# Patient Record
Sex: Male | Born: 1955 | Race: White | Hispanic: No | Marital: Married | Smoking: Never smoker
Health system: Southern US, Community
[De-identification: ages and names within clinical notes are randomized; demographics above are authoritative.]

## PROBLEM LIST (undated history)

## (undated) DIAGNOSIS — E119 Type 2 diabetes mellitus without complications: Secondary | ICD-10-CM

## (undated) DIAGNOSIS — I639 Cerebral infarction, unspecified: Secondary | ICD-10-CM

## (undated) HISTORY — PX: GALLBLADDER SURGERY: SHX652

## (undated) HISTORY — DX: Cerebral infarction, unspecified: I63.9

## (undated) HISTORY — DX: Type 2 diabetes mellitus without complications: E11.9

## (undated) HISTORY — PX: TONSILLECTOMY: SUR1361

---

## 2013-10-08 ENCOUNTER — Emergency Department (HOSPITAL_COMMUNITY): Payer: BC Managed Care – PPO

## 2013-10-08 ENCOUNTER — Encounter (HOSPITAL_BASED_OUTPATIENT_CLINIC_OR_DEPARTMENT_OTHER): Payer: Self-pay | Admitting: Emergency Medicine

## 2013-10-08 ENCOUNTER — Inpatient Hospital Stay (HOSPITAL_BASED_OUTPATIENT_CLINIC_OR_DEPARTMENT_OTHER)
Admission: EM | Admit: 2013-10-08 | Discharge: 2013-10-09 | DRG: 065 | Disposition: A | Discharge status: Home / Self Care | Payer: BC Managed Care – PPO | Attending: Internal Medicine | Admitting: Internal Medicine

## 2013-10-08 ENCOUNTER — Inpatient Hospital Stay (HOSPITAL_COMMUNITY): Payer: BC Managed Care – PPO

## 2013-10-08 ENCOUNTER — Emergency Department (HOSPITAL_BASED_OUTPATIENT_CLINIC_OR_DEPARTMENT_OTHER): Payer: BC Managed Care – PPO

## 2013-10-08 DIAGNOSIS — Z79899 Other long term (current) drug therapy: Secondary | ICD-10-CM

## 2013-10-08 DIAGNOSIS — Z6841 Body Mass Index (BMI) 40.0 and over, adult: Secondary | ICD-10-CM

## 2013-10-08 DIAGNOSIS — I6350 Cerebral infarction due to unspecified occlusion or stenosis of unspecified cerebral artery: Secondary | ICD-10-CM

## 2013-10-08 DIAGNOSIS — R5381 Other malaise: Secondary | ICD-10-CM

## 2013-10-08 DIAGNOSIS — R29898 Other symptoms and signs involving the musculoskeletal system: Secondary | ICD-10-CM | POA: Diagnosis present

## 2013-10-08 DIAGNOSIS — R748 Abnormal levels of other serum enzymes: Secondary | ICD-10-CM | POA: Diagnosis present

## 2013-10-08 DIAGNOSIS — Z23 Encounter for immunization: Secondary | ICD-10-CM

## 2013-10-08 DIAGNOSIS — E1165 Type 2 diabetes mellitus with hyperglycemia: Secondary | ICD-10-CM | POA: Diagnosis present

## 2013-10-08 DIAGNOSIS — I6330 Cerebral infarction due to thrombosis of unspecified cerebral artery: Principal | ICD-10-CM | POA: Diagnosis present

## 2013-10-08 DIAGNOSIS — IMO0001 Reserved for inherently not codable concepts without codable children: Secondary | ICD-10-CM | POA: Diagnosis present

## 2013-10-08 DIAGNOSIS — F17200 Nicotine dependence, unspecified, uncomplicated: Secondary | ICD-10-CM

## 2013-10-08 DIAGNOSIS — E785 Hyperlipidemia, unspecified: Secondary | ICD-10-CM | POA: Diagnosis present

## 2013-10-08 DIAGNOSIS — IMO0002 Reserved for concepts with insufficient information to code with codable children: Secondary | ICD-10-CM | POA: Diagnosis present

## 2013-10-08 DIAGNOSIS — Z794 Long term (current) use of insulin: Secondary | ICD-10-CM

## 2013-10-08 DIAGNOSIS — R918 Other nonspecific abnormal finding of lung field: Secondary | ICD-10-CM | POA: Diagnosis present

## 2013-10-08 DIAGNOSIS — I639 Cerebral infarction, unspecified: Secondary | ICD-10-CM | POA: Diagnosis present

## 2013-10-08 DIAGNOSIS — R471 Dysarthria and anarthria: Secondary | ICD-10-CM | POA: Diagnosis present

## 2013-10-08 DIAGNOSIS — R739 Hyperglycemia, unspecified: Secondary | ICD-10-CM | POA: Diagnosis present

## 2013-10-08 DIAGNOSIS — R5383 Other fatigue: Secondary | ICD-10-CM

## 2013-10-08 DIAGNOSIS — Z7982 Long term (current) use of aspirin: Secondary | ICD-10-CM

## 2013-10-08 DIAGNOSIS — I5189 Other ill-defined heart diseases: Secondary | ICD-10-CM

## 2013-10-08 LAB — PROTIME-INR
INR: 1.01 (ref 0.00–1.49)
PROTHROMBIN TIME: 13.1 s (ref 11.6–15.2)

## 2013-10-08 LAB — COMPREHENSIVE METABOLIC PANEL
ALT: 206 U/L — ABNORMAL HIGH (ref 0–53)
AST: 114 U/L — ABNORMAL HIGH (ref 0–37)
Albumin: 3.9 g/dL (ref 3.5–5.2)
Alkaline Phosphatase: 67 U/L (ref 39–117)
BUN: 16 mg/dL (ref 6–23)
CALCIUM: 9.5 mg/dL (ref 8.4–10.5)
CO2: 25 meq/L (ref 19–32)
CREATININE: 0.7 mg/dL (ref 0.50–1.35)
Chloride: 95 mEq/L — ABNORMAL LOW (ref 96–112)
GFR calc non Af Amer: >90 mL/min (ref >90)
GLUCOSE: 409 mg/dL — AB (ref 70–99)
Potassium: 4.6 mEq/L (ref 3.7–5.3)
Sodium: 135 mEq/L — ABNORMAL LOW (ref 137–147)
Total Bilirubin: 0.6 mg/dL (ref 0.3–1.2)
Total Protein: 7.5 g/dL (ref 6.0–8.3)

## 2013-10-08 LAB — CBC WITH DIFFERENTIAL/PLATELET
Basophils Absolute: 0.1 10*3/uL (ref 0.0–0.1)
Basophils Relative: 1 % (ref 0–1)
EOS PCT: 2 % (ref 0–5)
Eosinophils Absolute: 0.1 10*3/uL (ref 0.0–0.7)
HEMATOCRIT: 45.4 % (ref 39.0–52.0)
Hemoglobin: 16.3 g/dL (ref 13.0–17.0)
LYMPHS ABS: 1.7 10*3/uL (ref 0.7–4.0)
LYMPHS PCT: 28 % (ref 12–46)
MCH: 30 pg (ref 26.0–34.0)
MCHC: 35.9 g/dL (ref 30.0–36.0)
MCV: 83.6 fL (ref 78.0–100.0)
Monocytes Absolute: 0.5 10*3/uL (ref 0.1–1.0)
Monocytes Relative: 9 % (ref 3–12)
Neutro Abs: 3.6 10*3/uL (ref 1.7–7.7)
Neutrophils Relative %: 61 % (ref 43–77)
Platelets: 178 10*3/uL (ref 150–400)
RBC: 5.43 MIL/uL (ref 4.22–5.81)
RDW: 12.8 % (ref 11.5–15.5)
WBC: 5.9 10*3/uL (ref 4.0–10.5)

## 2013-10-08 LAB — TROPONIN I
Troponin I: <0.3 ng/mL (ref <0.30)
Troponin I: <0.3 ng/mL (ref <0.30)

## 2013-10-08 LAB — GLUCOSE, CAPILLARY
GLUCOSE-CAPILLARY: 186 mg/dL — AB (ref 70–99)
Glucose-Capillary: 159 mg/dL — ABNORMAL HIGH (ref 70–99)

## 2013-10-08 LAB — ETHANOL

## 2013-10-08 LAB — APTT: aPTT: 31 seconds (ref 24–37)

## 2013-10-08 MED ORDER — INSULIN REGULAR HUMAN 100 UNIT/ML IJ SOLN
6.0000 [IU] | Freq: Once | INTRAMUSCULAR | Status: AC
  Administered 2013-10-08: 6 [IU] via INTRAVENOUS
  Filled 2013-10-08: qty 1

## 2013-10-08 MED ORDER — SODIUM CHLORIDE 0.9 % IV SOLN
INTRAVENOUS | Status: DC
  Administered 2013-10-08: 18:00:00 via INTRAVENOUS

## 2013-10-08 MED ORDER — INSULIN ASPART 100 UNIT/ML ~~LOC~~ SOLN: 0.0000 [IU] | Freq: Three times a day (TID) | SUBCUTANEOUS | Status: DC

## 2013-10-08 MED ORDER — ASPIRIN EC 81 MG PO TBEC
81.0000 mg | DELAYED_RELEASE_TABLET | Freq: Every day | ORAL | Status: DC
Start: 2013-10-09 — End: 2013-10-09
  Administered 2013-10-09: 81 mg via ORAL
  Filled 2013-10-08: qty 1

## 2013-10-08 MED ORDER — SODIUM CHLORIDE 0.9 % IJ SOLN: 3.0000 mL | INTRAMUSCULAR | Status: DC | PRN

## 2013-10-08 MED ORDER — ATORVASTATIN CALCIUM 80 MG PO TABS
80.0000 mg | ORAL_TABLET | Freq: Every day | ORAL | Status: DC
  Administered 2013-10-09: 80 mg via ORAL
  Filled 2013-10-08: qty 1

## 2013-10-08 MED ORDER — ASPIRIN 325 MG PO TABS
325.0000 mg | ORAL_TABLET | Freq: Once | ORAL | Status: AC
  Administered 2013-10-08: 325 mg via ORAL
  Filled 2013-10-08: qty 1

## 2013-10-08 MED ORDER — SODIUM CHLORIDE 0.9 % IJ SOLN: 3.0000 mL | Freq: Two times a day (BID) | INTRAMUSCULAR | Status: DC

## 2013-10-08 MED ORDER — INSULIN ASPART 100 UNIT/ML ~~LOC~~ SOLN
0.0000 [IU] | Freq: Every day | SUBCUTANEOUS | Status: DC
  Administered 2013-10-08: 3 [IU] via SUBCUTANEOUS

## 2013-10-08 MED ORDER — SODIUM CHLORIDE 0.9 % IV SOLN: Freq: Once | INTRAVENOUS | Status: DC

## 2013-10-08 MED ORDER — INSULIN GLARGINE 100 UNIT/ML ~~LOC~~ SOLN
10.0000 [IU] | Freq: Two times a day (BID) | SUBCUTANEOUS | Status: DC
Start: 2013-10-08 — End: 2013-10-09
  Administered 2013-10-08 – 2013-10-09 (×2): 10 [IU] via SUBCUTANEOUS
  Filled 2013-10-08 (×3): qty 0.1

## 2013-10-08 MED ORDER — HEPARIN SODIUM (PORCINE) 5000 UNIT/ML IJ SOLN
5000.0000 [IU] | Freq: Three times a day (TID) | INTRAMUSCULAR | Status: DC
  Administered 2013-10-09 (×2): 5000 [IU] via SUBCUTANEOUS
  Filled 2013-10-08 (×4): qty 1

## 2013-10-08 MED ORDER — SENNOSIDES-DOCUSATE SODIUM 8.6-50 MG PO TABS: 1.0000 | ORAL_TABLET | Freq: Every evening | ORAL | Status: DC | PRN

## 2013-10-08 MED ORDER — OXYCODONE-ACETAMINOPHEN 5-325 MG PO TABS: 2.0000 | ORAL_TABLET | Freq: Once | ORAL | Status: DC

## 2013-10-08 MED ORDER — SODIUM CHLORIDE 0.9 % IV SOLN: 250.0000 mL | INTRAVENOUS | Status: DC | PRN

## 2013-10-08 MED ORDER — SODIUM CHLORIDE 0.9 % IJ SOLN
3.0000 mL | Freq: Two times a day (BID) | INTRAMUSCULAR | Status: DC
  Administered 2013-10-09: 3 mL via INTRAVENOUS

## 2013-10-08 NOTE — ED Provider Notes (Signed)
CSN: [id number]     Arrival date & time 10/08/13  0946 History   First MD Initiated Contact with Patient 10/08/13 1027     Chief Complaint  Patient presents with  . Extremity Weakness   (Consider location/radiation/quality/duration/timing/severity/associated sxs/prior Treatment) HPI Comments: Patient is a 58 year old male who presents with complaints of right arm and leg weakness that started yesterday at approximately 4 PM. He feels as though he has adequate strength however does not feel as though his arm and leg are "responsive". His wife is also noticed he is having a difficulty forming words and expressing what he wants to say.  Patient is a 58 y.o. male presenting with extremity weakness. The history is provided by the patient.  Extremity Weakness This is a new problem. Episode onset: Yesterday at 4 PM. The problem occurs constantly. The problem has not changed since onset.Pertinent negatives include no chest pain, no abdominal pain, no headaches and no shortness of breath. Nothing aggravates the symptoms. Nothing relieves the symptoms. He has tried nothing for the symptoms. The treatment provided no relief.    History reviewed. No pertinent past medical history. Past Surgical History  Procedure Laterality Date  . Tonsillectomy     No family history on file. History  Substance Use Topics  . Smoking status: Never Smoker   . Smokeless tobacco: Not on file  . Alcohol Use: No    Review of Systems  Respiratory: Negative for shortness of breath.   Cardiovascular: Negative for chest pain.  Gastrointestinal: Negative for abdominal pain.  Musculoskeletal: Positive for extremity weakness.  Neurological: Negative for headaches.  All other systems reviewed and are negative.    Allergies  Review of patient's allergies indicates no known allergies.  Home Medications  No current outpatient prescriptions on file. BP 170/92  Pulse 74  Temp(Src) 98.2 F (36.8 C) (Oral)  Resp 20  Ht  6' (1.829 m)  Wt 300 lb (136.079 kg)  BMI 40.68 kg/m2  SpO2 100% Physical Exam  Nursing note and vitals reviewed. Constitutional: He is oriented to person, place, and time. He appears well-developed and well-nourished. No distress.  HENT:  Head: Normocephalic and atraumatic.  Mouth/Throat: Oropharynx is clear and moist.  Eyes: EOM are normal. Pupils are equal, round, and reactive to light.  Neck: Normal range of motion. Neck supple.  Cardiovascular: Normal rate, regular rhythm and normal heart sounds.   No murmur heard. Pulmonary/Chest: Effort normal and breath sounds normal. No respiratory distress. He has no wheezes.  Abdominal: Soft. Bowel sounds are normal. He exhibits no distension. There is no tenderness.  Musculoskeletal: Normal range of motion. He exhibits no edema.  Lymphadenopathy:    He has no cervical adenopathy.  Neurological: He is alert and oriented to person, place, and time. No cranial nerve deficit. He exhibits normal muscle tone. Coordination normal.  Skin: Skin is warm and dry. He is not diaphoretic.    ED Course  Procedures (including critical care time) Labs Review Labs Reviewed  CBC WITH DIFFERENTIAL  COMPREHENSIVE METABOLIC PANEL   Imaging Review No results found.  EKG Interpretation    Date/Time:  Thursday October 08 2013 09:58:31 EST Ventricular Rate:  72 PR Interval:  168 QRS Duration: 118 QT Interval:  426 QTC Calculation: 466 R Axis:   -51 Text Interpretation:  Normal sinus rhythm Left anterior fascicular block Left ventricular hypertrophy with QRS widening Abnormal ECG Confirmed by DELOS  MD, Shanera Meske ([id number]) on 10/08/2013 10:36:21 AM  MDM  No diagnosis found. Patient is a 58 year old male presents with complaint of numbness of the right arm and leg but has been ongoing since yesterday at 4 PM. His neurologic exam is nonfocal and his symptoms are mainly subjective. Workup reveals a negative head CT along with a normal CBC. His  electrolyte panel does reveal an elevated glucose of 409. He was given 1 L of normal saline along with IV insulin to help correct this. I've discussed this patient with Dr. Leroy Kennedy from neurology who recommends completion of the TIA rule out with an MRI. As that is not available here today, he will be transferred to [facility] to undergo neurology consultation and MRI.    Geoffery Lyons, MD 10/08/13 [phone]

## 2013-10-08 NOTE — ED Notes (Signed)
Pt c/o right arm feeling "weak but equal strength" since 4pm yesterday. No facial asymmetry noted, equal grips and leg strength. Pt wife sts she thinks pt "doesn't seem to be as steady as normal".

## 2013-10-08 NOTE — H&P (Signed)
Date: 10/08/2013               Patient Name:  Lee Adams MRN: [id number]  DOB: 1956/04/18 Age / Sex: 58 y.o., male   PCP: No Pcp Per Patient         Medical Service: Internal Medicine Teaching Service         Attending Physician: Dr. Rocco Serene, MD    First Contact: Dr. Evelena Peat Pager: [phone]  Second Contact: Dr. Dede Query Pager: [phone]       After Hours (After 5p/  First Contact Pager: [phone]  weekends / holidays): Second Contact Pager: [phone]   Chief Complaint: right sided not responding  History of Present Illness: Lee Adams is a 58 year old male without significant past medical history who presented to [facility] today with 2 day complaint of right sided weakness.  The sensation began yesterday evening around 4AM.  He says his right side "wasn't responding."  He continued with his usual evening activities, was able to eat dinner and slept without problem.  When he awoke this morning the feeling was worse.  He says he does not feel weak but he feels like his right side is slower to respond.  He denies change in vision, gait dysfunction, lightheadedness, dizziness, headache, CP, palpitations or SOB.  He lives with his wife and she did not note any facial asymmetry, dysarthria or gait abnormality.  However, she did note that his speech seemed slower and that he seemed less confident while driving their new vehicle yesterday.   In the Med [facility] ED:  T 98.56F, RR 20, SpO2 100%, HR74; negative head CT; consult to neuro; BG 409.  Meds: Current Facility-Administered Medications  Medication Dose Route Frequency Provider Last Rate Last Dose  . 0.9 %  sodium chloride infusion   Intravenous STAT Hurman Horn, MD 125 mL/hr at 10/08/13 1818    . 0.9 %  sodium chloride infusion  250 mL Intravenous PRN Na Dierdre Searles, MD      . Melene Muller ON 10/09/2013] aspirin EC tablet 81 mg  81 mg Oral Daily Na Dierdre Searles, MD      . Melene Muller ON 10/09/2013] atorvastatin (LIPITOR) tablet 80 mg  80 mg  Oral q1800 Na Li, MD      . Melene Muller ON 10/09/2013] heparin injection 5,000 Units  5,000 Units Subcutaneous Q8H Na Li, MD      . insulin aspart (novoLOG) injection 0-5 Units  0-5 Units Subcutaneous QHS Na Li, MD      . Melene Muller ON 10/09/2013] insulin aspart (novoLOG) injection 0-9 Units  0-9 Units Subcutaneous TID WC Na Dierdre Searles, MD      . insulin glargine (LANTUS) injection 10 Units  10 Units Subcutaneous BID Na Li, MD      . senna-docusate (Senokot-S) tablet 1 tablet  1 tablet Oral QHS PRN Na Li, MD      . sodium chloride 0.9 % injection 3 mL  3 mL Intravenous Q12H Na Li, MD      . sodium chloride 0.9 % injection 3 mL  3 mL Intravenous Q12H Na Li, MD      . sodium chloride 0.9 % injection 3 mL  3 mL Intravenous PRN Dede Query, MD        Allergies: Allergies as of 10/08/2013  . (No Known Allergies)   History reviewed. No pertinent past medical history. Past Surgical History  Procedure Laterality Date  . Tonsillectomy  No family history on file. History   Social History  . Marital Status: Married    Spouse Name: N/A    Number of Children: N/A  . Years of Education: N/A   Occupational History  . Not on file.   Social History Main Topics  . Smoking status: Never Smoker   . Smokeless tobacco: Not on file  . Alcohol Use: No  . Drug Use: No  . Sexual Activity: Not on file   Other Topics Concern  . Not on file   Social History Narrative  . No narrative on file    Review of Systems: Pertinent items are noted in HPI.  Also reports polydipsia and polyuria for past few weeks.  Physical Exam: Blood pressure 156/80, pulse 68, temperature 97.8 F (36.6 C), temperature source Oral, resp. rate 18, height 6' (1.829 m), weight 136.09 kg (300 lb 0.4 oz), SpO2 91.00%. General: resting in bed in NAD HEENT: PERRL, EOMI, no scleral icterus Cardiac: RRR, no rubs, murmurs or gallops Pulm: clear to auscultation bilaterally, moving normal volumes of air Abd: soft, nontender, nondistended, BS  present Ext: warm and well perfused, no pedal edema, 2+ DPs Neuro: alert and oriented X3, cranial nerves II-XII grossly intact, 4.5/5 MMS RUE and RLE, 5/5 MMS LUE and LLE, intact sensation and coordination  Lab results: Basic Metabolic Panel:  Recent Labs  16/07/9600/01/15 1045  NA 135*  K 4.6  CL 95*  CO2 25  GLUCOSE 409*  BUN 16  CREATININE 0.70  CALCIUM 9.5   Liver Function Tests:  Recent Labs  10/08/13 1045  AST 114*  ALT 206*  ALKPHOS 67  BILITOT 0.6  PROT 7.5  ALBUMIN 3.9   CBC:  Recent Labs  10/08/13 1045  WBC 5.9  NEUTROABS 3.6  HGB 16.3  HCT 45.4  MCV 83.6  PLT 178   Cardiac Enzymes:  Recent Labs  10/08/13 1045  TROPONINI <0.30   CBG:  Recent Labs  10/08/13 1415 10/08/13 1735  GLUCAP 159* 186*   Coagulation:  Recent Labs  10/08/13 1631  LABPROT 13.1  INR 1.01   Alcohol Level:  Recent Labs  10/08/13 1631  ETH <11    Imaging results:  Ct Head Wo Contrast  10/08/2013   CLINICAL DATA:  Right-sided weakness.  EXAM: CT HEAD WITHOUT CONTRAST  TECHNIQUE: Contiguous axial images were obtained from the base of the skull through the vertex without intravenous contrast.  COMPARISON:  None.  FINDINGS: Ventricles, cisterns and other CSF spaces are within normal. There is no mass, mass effect, shift of midline structures or acute hemorrhage. There is no evidence to suggest acute infarction. Bones and soft tissues are unremarkable.  IMPRESSION: No acute intracranial findings.   Electronically Signed   By: Elberta Fortisaniel  Boyle M.D.   On: 10/08/2013 11:02   Mr Maxine GlennMra Head Wo Contrast  10/08/2013   CLINICAL DATA:  Right arm and leg numbness.  EXAM: MRI HEAD WITHOUT CONTRAST  MRA HEAD WITHOUT CONTRAST  TECHNIQUE: Multiplanar, multiecho pulse sequences of the brain and surrounding structures were obtained without intravenous contrast. Angiographic images of the head were obtained using MRA technique without contrast.  COMPARISON:  Head CT same day  FINDINGS: MRI HEAD  FINDINGS  There is a 1 cm region of acute infarction affecting the left pons. No other acute infarction. No cerebellar abnormality. The cerebral hemispheres show mild chronic small-vessel changes of the deep and subcortical white matter. No cortical or large vessel territory infarction. No mass lesion, hemorrhage,  hydrocephalus or extra-axial collection. No pituitary mass. No inflammatory sinus disease.  MRA HEAD FINDINGS  Both internal carotid arteries are widely patent into the brain. No siphon stenosis. The anterior and middle cerebral vessels are patent. There is atherosclerotic irregularity diffusely, most pronounced in the left MCA territory. There is stenosis at the left MCA bifurcation that appears to be severe. This would place the patient at risk of left MCA territory infarction.  Both vertebral arteries are patent with the left being dominant. There is artifact in the region with a left vertebral artery that simulates a localized dissection. This is not the case. The basilar artery shows some atherosclerotic irregularity but no flow-limiting stenosis. Posterior circulation branch vessels appear patent. The posterior cerebral arteries show some atherosclerotic irregularity.  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  Mild chronic small-vessel changes affecting the cerebral hemispheric deep white matter.  Advanced intracranial atherosclerotic disease, most severe in the left MCA territory. Severe stenosis at the left MCA bifurcation could place the patient at risk of left MCA territory infarction in the future.  Atherosclerotic irregularity of the posterior circulation branch vessels without dominant or correctable stenosis.   Electronically Signed   By: Paulina Fusi M.D.   On: 10/08/2013 15:52   Mr Brain Wo Contrast  10/08/2013   CLINICAL DATA:  Right arm and leg numbness.  EXAM: MRI HEAD WITHOUT CONTRAST  MRA HEAD WITHOUT CONTRAST  TECHNIQUE: Multiplanar, multiecho pulse sequences of  the brain and surrounding structures were obtained without intravenous contrast. Angiographic images of the head were obtained using MRA technique without contrast.  COMPARISON:  Head CT same day  FINDINGS: MRI HEAD FINDINGS  There is a 1 cm region of acute infarction affecting the left pons. No other acute infarction. No cerebellar abnormality. The cerebral hemispheres show mild chronic small-vessel changes of the deep and subcortical white matter. No cortical or large vessel territory infarction. No mass lesion, hemorrhage, hydrocephalus or extra-axial collection. No pituitary mass. No inflammatory sinus disease.  MRA HEAD FINDINGS  Both internal carotid arteries are widely patent into the brain. No siphon stenosis. The anterior and middle cerebral vessels are patent. There is atherosclerotic irregularity diffusely, most pronounced in the left MCA territory. There is stenosis at the left MCA bifurcation that appears to be severe. This would place the patient at risk of left MCA territory infarction.  Both vertebral arteries are patent with the left being dominant. There is artifact in the region with a left vertebral artery that simulates a localized dissection. This is not the case. The basilar artery shows some atherosclerotic irregularity but no flow-limiting stenosis. Posterior circulation branch vessels appear patent. The posterior cerebral arteries show some atherosclerotic irregularity.  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  Mild chronic small-vessel changes affecting the cerebral hemispheric deep white matter.  Advanced intracranial atherosclerotic disease, most severe in the left MCA territory. Severe stenosis at the left MCA bifurcation could place the patient at risk of left MCA territory infarction in the future.  Atherosclerotic irregularity of the posterior circulation branch vessels without dominant or correctable stenosis.   Electronically Signed   By: Paulina Fusi M.D.   On:  10/08/2013 15:52    Assessment & Plan by Problem: 58 year old male without significant past medical history who presents with 2 day complaint of right sided weakness and MRI confirmed infarction.  CVA:  Right sided weakness, slow speech with MRI confirmed 1cm acute infarction affecting left pons.  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  Denies HTN, HLD, or cigarette smoking (smokes cigars occasionally).  He may have undiagnosed DM (see below) which is a risk factor.   - Admit to IMTS  - start daily 81mg  ASA, Lipitor 80mg  - q2h neuro checks x 12 hours, then q4h - ECHO and carotid dopplers - Hgb A1c, fasting lipid panel  Type 2 DM:  Patient with BG 409 on BMP.  He also reports increased thirst and urination for the past few weeks.  TDD is 68 units, basal requirement 34 units.   - will start 10 units Lantus BID in this insulin naive patient; SSI - check HgbA1c, monitor CBG including 3AM check - DM education - will need list of primary care providers prior to d/c - social work consulted  Elevation of ALT and AST: ALT 114, AST 206.  He denies significant drinking history.  No past medical history. - recheck CMP in AM  VTE ppx:   SCDs; [place] heparin starting 10/09/13  Dispo: Disposition is deferred at this time, awaiting improvement of current medical problems. Anticipated discharge in approximately 1-2 day(s).   The patient does not have a current PCP (No Pcp Per Patient) and does need an [facility] hospital follow-up appointment after discharge.  The patient does not have transportation limitations that hinder transportation to clinic appointments.  Signed: Evelena Peat, DO 10/08/2013, 8:43 PM

## 2013-10-08 NOTE — ED Notes (Signed)
Received from [facility] truck [id number]-- [facility]. Pt alert, sitting upright on arrival

## 2013-10-08 NOTE — ED Provider Notes (Signed)
Last known well yesterday afternoon now has 22 hours of mild dysarthria mild weakness and numbness right face arm and leg with mild incoordination right [facility] no bleeding [facility] and neuro [facility].  D/w Neuro and Med for admit.Patient / Family / Caregiver informed of clinical course, understand medical decision-making process, and agree with plan.  Hurman HornJohn M Annelle Behrendt, MD 10/09/13 [phone]

## 2013-10-08 NOTE — Consult Note (Signed)
Referring Physician: Stevie Kern    Chief Complaint: right sided weakness  HPI:                                                                                                                                         Lee Adams is an 58 y.o. male who noted at approximately 4 PM yesterday his right arm legend face felt funny and his right arm was more clumsy than normal. HE did not seek medical attention at that time. Due to awakening this AM and symptoms had not resolved he was brought to [facility].  There his blood sugar was 409 and he continued to have right sided weakness. Stroke was suspected and patient was brought to [facility] for further evaluation.   On arrival patient obtained a MRI brian which confirmed a left pontine infarct. Patient continues to have right arm and leg weakness along with dysarthria.   Date last known well: Date: 10/07/2013 Time last known well: Time: 16:00 tPA Given: No: out of the window  History reviewed. No pertinent past medical history.  Past Surgical History  Procedure Laterality Date  . Tonsillectomy      No family history on file. Social History:  reports that he has never smoked. He does not have any smokeless tobacco history on file. He reports that he does not drink alcohol or use illicit drugs.  Allergies: No Known Allergies  Medications:                                                                                                                           Current Facility-Administered Medications  Medication Dose Route Frequency Provider Last Rate Last Dose  . 0.9 %  sodium chloride infusion   Intravenous Once Veryl Speak, MD       No current outpatient prescriptions on file.     ROS:  History obtained from the patient  General ROS: negative for - chills, fatigue, fever, night sweats, weight  gain or weight loss Psychological ROS: negative for - behavioral disorder, hallucinations, memory difficulties, mood swings or suicidal ideation Ophthalmic ROS: negative for - blurry vision, double vision, eye pain or loss of vision ENT ROS: negative for - epistaxis, nasal discharge, oral lesions, sore throat, tinnitus or vertigo Allergy and Immunology ROS: negative for - hives or itchy/watery eyes Hematological and Lymphatic ROS: negative for - bleeding problems, bruising or swollen lymph nodes Endocrine ROS: negative for - galactorrhea, hair pattern changes, polydipsia/polyuria or temperature intolerance Respiratory ROS: negative for - cough, hemoptysis, shortness of breath or wheezing Cardiovascular ROS: negative for - chest pain, dyspnea on exertion, edema or irregular heartbeat Gastrointestinal ROS: negative for - abdominal pain, diarrhea, hematemesis, nausea/vomiting or stool incontinence Genito-Urinary ROS: negative for - dysuria, hematuria, incontinence or urinary frequency/urgency Musculoskeletal ROS: negative for - joint swelling or muscular weakness Neurological ROS: as noted in HPI Dermatological ROS: negative for rash and skin lesion changes  Neurologic Examination:                                                                                                      Blood pressure 124/80, pulse 80, temperature 98.1 F (36.7 C), temperature source Oral, resp. rate 17, height 6' (1.829 m), weight 136.079 kg (300 lb), SpO2 94.00%.   Mental Status: Alert, oriented, thought content appropriate.  Speech dysarthric without evidence of aphasia.  Able to follow 3 step commands without difficulty. Cranial Nerves: II: Discs flat bilaterally; Visual fields grossly normal, pupils equal, round, reactive to light and accommodation III,IV, VI: ptosis not present, extra-ocular motions intact bilaterally V,VII: smile symmetric, facial light touch sensation normal bilaterally VIII: hearing normal  bilaterally IX,X: gag reflex present XI: bilateral shoulder shrug XII: midline tongue extension without atrophy or fasciculations  Motor: Right : Upper extremity   4/5    Left:     Upper extremity   5/5  Lower extremity   4/5     Lower extremity   5/5 --no drift noted Tone and bulk:normal tone throughout; no atrophy noted Sensory: Pinprick and light touch intact throughout, bilaterally Deep Tendon Reflexes:  Right: Upper Extremity   Left: Upper extremity   biceps (C-5 to C-6) 2/4   biceps (C-5 to C-6) 2/4 tricep (C7) 2/4    triceps (C7) 2/4 Brachioradialis (C6) 2/4  Brachioradialis (C6) 2/4  Lower Extremity Lower Extremity  quadriceps (L-2 to L-4) 2/4   quadriceps (L-2 to L-4) 2/4 Achilles (S1) 2/4   Achilles (S1) 2/4  Plantars: Right: downgoing   Left: downgoing Cerebellar: Dysmetric finger-to-nose on the right likely due to decreased strangth,  Ataxic heel-to-shin test on the right likely due to strength Gait: not tested due to multiple leads.  CV: pulses palpable throughout    Results for orders placed during the hospital encounter of 10/08/13 (from the past 48 hour(s))  CBC WITH DIFFERENTIAL     Status: None   Collection Time    10/08/13 10:45 AM  Result Value Range   WBC 5.9  4.0 - 10.5 K/uL   RBC 5.43  4.22 - 5.81 MIL/uL   Hemoglobin 16.3  13.0 - 17.0 g/dL   HCT 45.4  39.0 - 52.0 %   MCV 83.6  78.0 - 100.0 fL   MCH 30.0  26.0 - 34.0 pg   MCHC 35.9  30.0 - 36.0 g/dL   RDW 12.8  11.5 - 15.5 %   Platelets 178  150 - 400 K/uL   Neutrophils Relative % 61  43 - 77 %   Neutro Abs 3.6  1.7 - 7.7 K/uL   Lymphocytes Relative 28  12 - 46 %   Lymphs Abs 1.7  0.7 - 4.0 K/uL   Monocytes Relative 9  3 - 12 %   Monocytes Absolute 0.5  0.1 - 1.0 K/uL   Eosinophils Relative 2  0 - 5 %   Eosinophils Absolute 0.1  0.0 - 0.7 K/uL   Basophils Relative 1  0 - 1 %   Basophils Absolute 0.1  0.0 - 0.1 K/uL  COMPREHENSIVE METABOLIC PANEL     Status: Abnormal   Collection Time     10/08/13 10:45 AM      Result Value Range   Sodium 135 (*) 137 - 147 mEq/L   Comment: Please note change in reference range.   Potassium 4.6  3.7 - 5.3 mEq/L   Comment: Please note change in reference range.   Chloride 95 (*) 96 - 112 mEq/L   CO2 25  19 - 32 mEq/L   Glucose, Bld 409 (*) 70 - 99 mg/dL   BUN 16  6 - 23 mg/dL   Creatinine, Ser 0.70  0.50 - 1.35 mg/dL   Calcium 9.5  8.4 - 10.5 mg/dL   Total Protein 7.5  6.0 - 8.3 g/dL   Albumin 3.9  3.5 - 5.2 g/dL   AST 114 (*) 0 - 37 U/L   ALT 206 (*) 0 - 53 U/L   Alkaline Phosphatase 67  39 - 117 U/L   Total Bilirubin 0.6  0.3 - 1.2 mg/dL   GFR calc non Af Amer >90  >90 mL/min   GFR calc Af Amer >90  >90 mL/min   Comment: (NOTE)     The eGFR has been calculated using the CKD EPI equation.     This calculation has not been validated in all clinical situations.     eGFR's persistently <90 mL/min signify possible Chronic Kidney     Disease.  TROPONIN I     Status: None   Collection Time    10/08/13 10:45 AM      Result Value Range   Troponin I <0.30  <0.30 ng/mL   Comment:            Due to the release kinetics of cTnI,     a negative result within the first hours     of the onset of symptoms does not rule out     myocardial infarction with certainty.     If myocardial infarction is still suspected,     repeat the test at appropriate intervals.  GLUCOSE, CAPILLARY     Status: Abnormal   Collection Time    10/08/13  2:15 PM      Result Value Range   Glucose-Capillary 159 (*) 70 - 99 mg/dL   Ct Head Wo Contrast  10/08/2013   CLINICAL DATA:  Right-sided weakness.  EXAM: CT HEAD WITHOUT CONTRAST  TECHNIQUE: Contiguous axial images were obtained from the base of the skull through the vertex without intravenous contrast.  COMPARISON:  None.  FINDINGS: Ventricles, cisterns and other CSF spaces are within normal. There is no mass, mass effect, shift of midline structures or acute hemorrhage. There is no evidence to suggest acute  infarction. Bones and soft tissues are unremarkable.  IMPRESSION: No acute intracranial findings.   Electronically Signed   By: Marin Olp M.D.   On: 10/08/2013 11:02    Assessment and plan discussed with with attending physician and they are in agreement.    Stroke Risk Factors - none  Etta Quill PA-C [facility] [phone]  10/08/2013, 3:43 PM   Assessment: 58 y.o. male with new onset of dysarthria, right sided weakness in the setting of acute left pontine infarct and hyperglycemia.  Initial systolic BP was elevated at 170 but currently 124. Infarct likely secondary to small vessel disease given distribution and elevated BG.   Recommend:   Plan: 1. HgbA1c, fasting lipid panel 2. PT consult, OT consult, Speech consult 3. Echocardiogram 4. Carotid dopplers 5. Prophylactic therapy-Antiplatelet med: Aspirin - dose  81 mg daily 6. Risk  Factor modification        Patient seen and examined together with physician assistant and I concur with the assessment and plan.  Dorian Pod, MD

## 2013-10-08 NOTE — Progress Notes (Signed)
Patient was admitted to the floor through the ED after reported was given 30 minutes after. Alert and oriented. Vitals checked and recorded in epic. Oriented to the room and call bell given to him.

## 2013-10-09 ENCOUNTER — Inpatient Hospital Stay (HOSPITAL_COMMUNITY): Payer: BC Managed Care – PPO

## 2013-10-09 DIAGNOSIS — I6350 Cerebral infarction due to unspecified occlusion or stenosis of unspecified cerebral artery: Secondary | ICD-10-CM

## 2013-10-09 DIAGNOSIS — R748 Abnormal levels of other serum enzymes: Secondary | ICD-10-CM

## 2013-10-09 DIAGNOSIS — I519 Heart disease, unspecified: Secondary | ICD-10-CM

## 2013-10-09 DIAGNOSIS — R918 Other nonspecific abnormal finding of lung field: Secondary | ICD-10-CM

## 2013-10-09 DIAGNOSIS — IMO0002 Reserved for concepts with insufficient information to code with codable children: Secondary | ICD-10-CM | POA: Diagnosis present

## 2013-10-09 DIAGNOSIS — I5189 Other ill-defined heart diseases: Secondary | ICD-10-CM | POA: Diagnosis present

## 2013-10-09 DIAGNOSIS — E1165 Type 2 diabetes mellitus with hyperglycemia: Secondary | ICD-10-CM | POA: Diagnosis present

## 2013-10-09 LAB — COMPREHENSIVE METABOLIC PANEL
ALBUMIN: 3.5 g/dL (ref 3.5–5.2)
ALK PHOS: 60 U/L (ref 39–117)
ALT: 200 U/L — ABNORMAL HIGH (ref 0–53)
AST: 120 U/L — AB (ref 0–37)
BILIRUBIN TOTAL: 0.6 mg/dL (ref 0.3–1.2)
BUN: 20 mg/dL (ref 6–23)
CHLORIDE: 98 meq/L (ref 96–112)
CO2: 26 meq/L (ref 19–32)
Calcium: 9.3 mg/dL (ref 8.4–10.5)
Creatinine, Ser: 0.73 mg/dL (ref 0.50–1.35)
GFR calc Af Amer: >90 mL/min (ref >90)
GFR calc non Af Amer: >90 mL/min (ref >90)
Glucose, Bld: 289 mg/dL — ABNORMAL HIGH (ref 70–99)
POTASSIUM: 4.4 meq/L (ref 3.7–5.3)
Sodium: 137 mEq/L (ref 137–147)
Total Protein: 6.9 g/dL (ref 6.0–8.3)

## 2013-10-09 LAB — GLUCOSE, CAPILLARY
GLUCOSE-CAPILLARY: 276 mg/dL — AB (ref 70–99)
Glucose-Capillary: 288 mg/dL — ABNORMAL HIGH (ref 70–99)
Glucose-Capillary: 301 mg/dL — ABNORMAL HIGH (ref 70–99)
Glucose-Capillary: 296 mg/dL — ABNORMAL HIGH (ref 70–99)

## 2013-10-09 LAB — CBC
HCT: 43.8 % (ref 39.0–52.0)
Hemoglobin: 15.7 g/dL (ref 13.0–17.0)
MCH: 30.3 pg (ref 26.0–34.0)
MCHC: 35.8 g/dL (ref 30.0–36.0)
MCV: 84.4 fL (ref 78.0–100.0)
PLATELETS: 181 10*3/uL (ref 150–400)
RBC: 5.19 MIL/uL (ref 4.22–5.81)
RDW: 12.7 % (ref 11.5–15.5)
WBC: 7.8 10*3/uL (ref 4.0–10.5)

## 2013-10-09 LAB — MAGNESIUM: MAGNESIUM: 1.9 mg/dL (ref 1.5–2.5)

## 2013-10-09 LAB — TROPONIN I: Troponin I: <0.3 ng/mL (ref <0.30)

## 2013-10-09 LAB — HEPATITIS PANEL, ACUTE
HCV AB: NEGATIVE
HEP A IGM: NONREACTIVE
Hep B C IgM: NONREACTIVE
Hepatitis B Surface Ag: NEGATIVE

## 2013-10-09 LAB — BASIC METABOLIC PANEL
BUN: 20 mg/dL (ref 6–23)
CHLORIDE: 98 meq/L (ref 96–112)
CO2: 26 meq/L (ref 19–32)
Calcium: 9.2 mg/dL (ref 8.4–10.5)
Creatinine, Ser: 0.74 mg/dL (ref 0.50–1.35)
GFR calc non Af Amer: >90 mL/min (ref >90)
Glucose, Bld: 263 mg/dL — ABNORMAL HIGH (ref 70–99)
POTASSIUM: 3.7 meq/L (ref 3.7–5.3)
Sodium: 135 mEq/L — ABNORMAL LOW (ref 137–147)

## 2013-10-09 LAB — LIPID PANEL
CHOL/HDL RATIO: 7.4 ratio
CHOLESTEROL: 244 mg/dL — AB (ref 0–200)
HDL: 33 mg/dL — AB (ref >39)
LDL Cholesterol: 170 mg/dL — ABNORMAL HIGH (ref 0–99)
Triglycerides: 206 mg/dL — ABNORMAL HIGH (ref <150)
VLDL: 41 mg/dL — ABNORMAL HIGH (ref 0–40)

## 2013-10-09 LAB — HEMOGLOBIN A1C
Hgb A1c MFr Bld: 14.8 % — ABNORMAL HIGH (ref <5.7)
MEAN PLASMA GLUCOSE: 378 mg/dL — AB (ref <117)

## 2013-10-09 LAB — TSH: TSH: 2.903 u[IU]/mL (ref 0.350–4.500)

## 2013-10-09 MED ORDER — INSULIN GLARGINE 100 UNIT/ML ~~LOC~~ SOLN
15.0000 [IU] | Freq: Two times a day (BID) | SUBCUTANEOUS | Status: DC
  Filled 2013-10-09: qty 0.15

## 2013-10-09 MED ORDER — BD GETTING STARTED TAKE HOME KIT: 3/10ML X 30G SYRINGES
1.0000 | Freq: Once | Status: AC
  Administered 2013-10-09: 1
  Filled 2013-10-09: qty 1

## 2013-10-09 MED ORDER — INFLUENZA VAC SPLIT QUAD 0.5 ML IM SUSP: 0.5000 mL | INTRAMUSCULAR | Status: DC

## 2013-10-09 MED ORDER — PERFLUTREN LIPID MICROSPHERE
1.0000 mL | INTRAVENOUS | Status: AC | PRN
  Administered 2013-10-09: 2 mL via INTRAVENOUS
  Filled 2013-10-09: qty 10

## 2013-10-09 MED ORDER — INSULIN GLARGINE 100 UNITS/ML SOLOSTAR PEN: 15.0000 [IU] | PEN_INJECTOR | Freq: Two times a day (BID) | SUBCUTANEOUS | Status: AC

## 2013-10-09 MED ORDER — LANCETS 30G MISC: Status: AC

## 2013-10-09 MED ORDER — ATORVASTATIN CALCIUM 80 MG PO TABS: 80.0000 mg | ORAL_TABLET | Freq: Every day | ORAL | Status: AC

## 2013-10-09 MED ORDER — LIVING WELL WITH DIABETES BOOK
Freq: Once | Status: AC
  Administered 2013-10-09: 12:00:00
  Filled 2013-10-09: qty 1

## 2013-10-09 MED ORDER — INSULIN GLARGINE 100 UNIT/ML ~~LOC~~ SOLN: 15.0000 [IU] | Freq: Every day | SUBCUTANEOUS | Status: DC

## 2013-10-09 MED ORDER — BD GETTING STARTED TAKE HOME KIT: 1/2ML X 30G SYRINGES
1.0000 | Freq: Once | Status: DC
  Filled 2013-10-09: qty 1

## 2013-10-09 MED ORDER — ONETOUCH ULTRA SYSTEM W/DEVICE KIT: 1.0000 | PACK | Freq: Once | Status: AC

## 2013-10-09 MED ORDER — GLUCOSE BLOOD VI STRP: ORAL_STRIP | Status: AC

## 2013-10-09 MED ORDER — INSULIN ASPART 100 UNIT/ML ~~LOC~~ SOLN
0.0000 [IU] | Freq: Three times a day (TID) | SUBCUTANEOUS | Status: DC
  Administered 2013-10-09: 8 [IU] via SUBCUTANEOUS
  Administered 2013-10-09: 3 [IU] via SUBCUTANEOUS

## 2013-10-09 MED ORDER — "INSULIN SYRINGE 31G X 5/16"" 1 ML MISC": Status: AC

## 2013-10-09 MED ORDER — INSULIN GLARGINE 100 UNIT/ML ~~LOC~~ SOLN
5.0000 [IU] | Freq: Once | SUBCUTANEOUS | Status: AC
  Administered 2013-10-09: 5 [IU] via SUBCUTANEOUS
  Filled 2013-10-09: qty 0.05

## 2013-10-09 MED ORDER — ASPIRIN 81 MG PO TBEC: 81.0000 mg | DELAYED_RELEASE_TABLET | Freq: Every day | ORAL | Status: AC

## 2013-10-09 NOTE — Progress Notes (Signed)
Subjective: Lee Adams is doing relatively well this morning.  States that his right arm feels about the same ("less responsive"), but right leg strength improved.  He denies history of smoking.  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   Objective: Vital signs in last 24 hours: Filed Vitals:   10/09/13 0215 10/09/13 0405 10/09/13 0531 10/09/13 1123  BP: 137/59 132/62 170/92 150/90  Pulse: 61 62 62 74  Temp: 97.7 F (36.5 C) 97.5 F (36.4 C) 97.8 F (36.6 C) 98.4 F (36.9 C)  TempSrc: Oral Oral Oral Oral  Resp: 16 18 18 18   Height:      Weight:      SpO2: 96% 97% 95% 98%   Weight change:   Intake/Output Summary (Last 24 hours) at 10/09/13 1142 Last data filed at 10/08/13 1800  Gross per 24 hour  Intake    360 ml  Output      0 ml  Net    360 ml   PEX General: alert, cooperative, NAD HEENT: NCAT, vision grossly intact Neck: supple, no lymphadenopathy Lungs: clear to ascultation bilaterally, normal work of respiration, no wheezes, rales, ronchi Heart: regular rate and rhythm, no murmurs, gallops, or rubs Abdomen: soft, non-tender, non-distended, normal bowel sounds Extremities: 2+ DP/PT pulses bilaterally, no cyanosis, clubbing, or edema Neurologic: alert & oriented X3, cranial nerves II-XII intact, sensation intact to light touch; 5-/5 strength in right upper extremity otherwise strength intact throughout  Lab Results: Basic Metabolic Panel:  Recent Labs Lab 10/08/13 2325 10/09/13 0500  NA 135* 137  K 3.7 4.4  CL 98 98  CO2 26 26  GLUCOSE 263* 289*  BUN 20 20  CREATININE 0.74 0.73  CALCIUM 9.2 9.3  MG  --  1.9   Liver Function Tests:  Recent Labs Lab 10/08/13 1045 10/09/13 0500  AST 114* 120*  ALT 206* 200*  ALKPHOS 67 60  BILITOT 0.6 0.6  PROT 7.5 6.9  ALBUMIN 3.9 3.5   CBC:  Recent Labs Lab 10/08/13 1045 10/08/13 2325  WBC 5.9 7.8  NEUTROABS 3.6  --   HGB 16.3 15.7  HCT 45.4 43.8  MCV 83.6 84.4  PLT 178 181   Cardiac  Enzymes:  Recent Labs Lab 10/08/13 2120 10/09/13 0157 10/09/13 0805  TROPONINI <0.30 <0.30 <0.30   CBG:  Recent Labs Lab 10/08/13 1415 10/08/13 1735 10/08/13 2121 10/09/13 0644 10/09/13 1119  GLUCAP 159* 186* 288* 301* 296*   Fasting Lipid Panel:  Recent Labs Lab 10/09/13 0500  CHOL 244*  HDL 33*  LDLCALC 170*  TRIG 206*  CHOLHDL 7.4   Thyroid Function Tests:  Recent Labs Lab 10/08/13 2120  TSH 2.903   Coagulation:  Recent Labs Lab 10/08/13 1631  LABPROT 13.1  INR 1.01   Alcohol Level:  Recent Labs Lab 10/08/13 1631  ETH <11    Studies/Results: Dg Chest 2 View  10/08/2013   CLINICAL DATA:  Stroke.  EXAM: CHEST  2 VIEW  COMPARISON:  No priors.  FINDINGS: Lung volumes are normal. No consolidative airspace disease. No pleural effusions. No pneumothorax. In the right mid lung appreciated only on the frontal projection there are 2 small nodular opacities projecting over the posterior aspects of the right 7th and 8th ribs, both subcentimeter in size. Pulmonary vasculature and the cardiomediastinal silhouette are within normal limits.  IMPRESSION: 1. No radiographic evidence of acute cardiopulmonary disease. 2. 2 small nodular opacities projecting over the right mid lung noted only on the  frontal projection. Repeat standing PA and lateral chest x-ray in 2-3 months is recommended to ensure the stability or resolution of these findings.   Electronically Signed   By: Trudie Reedaniel  Entrikin M.D.   On: 10/08/2013 22:38   Ct Head Wo Contrast  10/08/2013   CLINICAL DATA:  Right-sided weakness.  EXAM: CT HEAD WITHOUT CONTRAST  TECHNIQUE: Contiguous axial images were obtained from the base of the skull through the vertex without intravenous contrast.  COMPARISON:  None.  FINDINGS: Ventricles, cisterns and other CSF spaces are within normal. There is no mass, mass effect, shift of midline structures or acute hemorrhage. There is no evidence to suggest acute infarction. Bones and  soft tissues are unremarkable.  IMPRESSION: No acute intracranial findings.   Electronically Signed   By: Elberta Fortisaniel  Boyle M.D.   On: 10/08/2013 11:02   Mr Maxine GlennMra Head Wo Contrast  10/08/2013   CLINICAL DATA:  Right arm and leg numbness.  EXAM: MRI HEAD WITHOUT CONTRAST  MRA HEAD WITHOUT CONTRAST  TECHNIQUE: Multiplanar, multiecho pulse sequences of the brain and surrounding structures were obtained without intravenous contrast. Angiographic images of the head were obtained using MRA technique without contrast.  COMPARISON:  Head CT same day  FINDINGS: MRI HEAD FINDINGS  There is a 1 cm region of acute infarction affecting the left pons. No other acute infarction. No cerebellar abnormality. The cerebral hemispheres show mild chronic small-vessel changes of the deep and subcortical white matter. No cortical or large vessel territory infarction. No mass lesion, hemorrhage, hydrocephalus or extra-axial collection. No pituitary mass. No inflammatory sinus disease.  MRA HEAD FINDINGS  Both internal carotid arteries are widely patent into the brain. No siphon stenosis. The anterior and middle cerebral vessels are patent. There is atherosclerotic irregularity diffusely, most pronounced in the left MCA territory. There is stenosis at the left MCA bifurcation that appears to be severe. This would place the patient at risk of left MCA territory infarction.  Both vertebral arteries are patent with the left being dominant. There is artifact in the region with a left vertebral artery that simulates a localized dissection. This is not the case. The basilar artery shows some atherosclerotic irregularity but no flow-limiting stenosis. Posterior circulation branch vessels appear patent. The posterior cerebral arteries show some atherosclerotic irregularity.  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  Mild chronic small-vessel changes affecting the cerebral hemispheric deep white matter.  Advanced intracranial  atherosclerotic disease, most severe in the left MCA territory. Severe stenosis at the left MCA bifurcation could place the patient at risk of left MCA territory infarction in the future.  Atherosclerotic irregularity of the posterior circulation branch vessels without dominant or correctable stenosis.   Electronically Signed   By: Paulina FusiMark  Shogry M.D.   On: 10/08/2013 15:52   Mr Brain Wo Contrast  10/08/2013   CLINICAL DATA:  Right arm and leg numbness.  EXAM: MRI HEAD WITHOUT CONTRAST  MRA HEAD WITHOUT CONTRAST  TECHNIQUE: Multiplanar, multiecho pulse sequences of the brain and surrounding structures were obtained without intravenous contrast. Angiographic images of the head were obtained using MRA technique without contrast.  COMPARISON:  Head CT same day  FINDINGS: MRI HEAD FINDINGS  There is a 1 cm region of acute infarction affecting the left pons. No other acute infarction. No cerebellar abnormality. The cerebral hemispheres show mild chronic small-vessel changes of the deep and subcortical white matter. No cortical or large vessel territory infarction. No mass lesion, hemorrhage, hydrocephalus or extra-axial collection.  No pituitary mass. No inflammatory sinus disease.  MRA HEAD FINDINGS  Both internal carotid arteries are widely patent into the brain. No siphon stenosis. The anterior and middle cerebral vessels are patent. There is atherosclerotic irregularity diffusely, most pronounced in the left MCA territory. There is stenosis at the left MCA bifurcation that appears to be severe. This would place the patient at risk of left MCA territory infarction.  Both vertebral arteries are patent with the left being dominant. There is artifact in the region with a left vertebral artery that simulates a localized dissection. This is not the case. The basilar artery shows some atherosclerotic irregularity but no flow-limiting stenosis. Posterior circulation branch vessels appear patent. The posterior cerebral  arteries show some atherosclerotic irregularity.  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  Mild chronic small-vessel changes affecting the cerebral hemispheric deep white matter.  Advanced intracranial atherosclerotic disease, most severe in the left MCA territory. Severe stenosis at the left MCA bifurcation could place the patient at risk of left MCA territory infarction in the future.  Atherosclerotic irregularity of the posterior circulation branch vessels without dominant or correctable stenosis.   Electronically Signed   By: Paulina Fusi M.D.   On: 10/08/2013 15:52   US Abdomen Complete  10/09/2013   CLINICAL DATA:  Cholecystectomy.  Possible cirrhosis.  EXAM: ULTRASOUND ABDOMEN COMPLETE  COMPARISON:  CT 10/09/2013.  FINDINGS: Gallbladder:  Cholecystectomy. Surgical clips are noted in the gallbladder fossa on CT to confirm that a cholecystectomy has been performed. No abnormal fluid collections noted in the gallbladder fossa.  Common bile duct:  Diameter: 9.3 mm. This mild distention may be related to prior cholecystectomy. Please correlate with laboratory values.  Liver:  No focal abnormality identified. Coarse echotexture is present. This can be seen with cirrhosis and/or fatty infiltration. Portal vein is patent with normal directional flow.  IVC:  No abnormality visualized.  Pancreas:  Visualized portion unremarkable.  Spleen:  Size and appearance within normal limits.  Right Kidney:  Length: 13.0 cm. Echogenicity within normal limits. No mass or hydronephrosis visualized.  Left Kidney:  Length: 12.8 cm. Echogenicity within normal limits. No mass or hydronephrosis visualized.  Abdominal aorta:  No aneurysm visualized.  Other findings:  None.  IMPRESSION: 1. Prior cholecystectomy. Common bile duct measures 9.3 mm. This slight dilatation is most likely from prior cholecystectomy. Correlate with liver/biliary function tests. 2. Liver demonstrates coarse echotexture. This can be seen with  cirrhosis and/or fatty infiltration. Portal vein is patent with normal directional flow. 3. Coarse hepatic   Electronically Signed   By: Maisie Fus  Register   On: 10/09/2013 11:08   Medications: I have reviewed the patient's current medications. Scheduled Meds: . aspirin EC  81 mg Oral Daily  . atorvastatin  80 mg Oral q1800  . heparin  5,000 Units Subcutaneous Q8H  . [START ON 10/10/2013] influenza vac split quadrivalent PF  0.5 mL Intramuscular Tomorrow-1000  . insulin aspart  0-15 Units Subcutaneous TID WC  . insulin aspart  0-5 Units Subcutaneous QHS  . insulin glargine  10 Units Subcutaneous BID  . sodium chloride  3 mL Intravenous Q12H   PRN Meds:.senna-docusate Assessment/Plan: Stroke: Patient presented with right sided weakness, slow speech.  MRI confirmed 1cm acute infarction affecting left pons. 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 Denies HTN, HL, or cigarette smoking (smokes cigars occasionally).  Troponin x 3 negative.  Risk stratification: A1C 14.8%,  LDL 170, TSH within normal limits.  Carotid dopplers negative.  Echo showed EF 50%, normal wall motion, grade I diastolic dysfunction.  - consult to neurology, appreciate recs - start daily 81mg  ASA, Lipitor 80mg   - neuro checks q4h   DM2: A1C 14.8%. Patient reports increased thirst and urination for the past few weeks. TDD is 68 units, basal requirement 34 units.  - increase Lantus to 15 units BID, continue SSI-moderate  Elevation of ALT, AST: ALT 120, AST 200 today.  No alcohol abuse history.  Likely due to fatty liver.  - hepatitis panel pending - liver ultrasound   Lung nodules on CXR- CXR report recommends repeat CT chest or repeat CXR - non-contrast CT chest  Dispo: Disposition is deferred at this time, awaiting improvement of current medical problems.  Anticipated discharge in approximately 1 day(s).   The patient does not have a current PCP (No  Pcp Per Patient) and does need an [facility] hospital follow-up appointment after discharge. Dr. Rocky Link at [facility] on 1/12 at 8am  .Services Needed at time of discharge: Y = Yes, Blank = No PT:   OT:   RN:   Equipment:   Other:     LOS: 1 day   Rocco Serene, MD 10/09/2013, 11:42 AM

## 2013-10-09 NOTE — Progress Notes (Signed)
Inpatient Diabetes Program Recommendations  AACE/ADA: New Consensus Statement on Inpatient Glycemic Control (2013)  Target Ranges:  Prepandial:   less than 140 mg/dL      Peak postprandial:   less than 180 mg/dL (1-2 hours)      Critically ill patients:  140 - 180 mg/dL   Reason for Visit: New onset DM  Patient is a 58 year old male who presents with complaints of right arm and leg weakness that started yesterday at approximately 4 PM. He feels as though he has adequate strength however does not feel as though his arm and leg are "responsive". His wife is also noticed he is having a difficulty forming words and expressing what he wants to say. Glucose 409 on admission. Results for CHANCELLOR, VANDERLOOP (MRN [id number]) as of 10/09/2013 17:06  Ref. Range 10/08/2013 10:45 10/08/2013 23:25 10/09/2013 05:00  Glucose Latest Range: 70-99 mg/dL 409 (H) 263 (H) 289 (H)  Results for ROGAN, WIGLEY (MRN [id number]) as of 10/09/2013 17:06  Ref. Range 10/09/2013 05:00  Hemoglobin A1C Latest Range: <5.7 % 14.8 (H)  Results for MATEI, MAGNONE (MRN [id number]) as of 10/09/2013 17:06  Ref. Range 10/09/2013 05:00  Sodium Latest Range: 137-147 mEq/L 137  Potassium Latest Range: 3.7-5.3 mEq/L 4.4  Chloride Latest Range: 96-112 mEq/L 98  CO2 Latest Range: 19-32 mEq/L 26  Mean Plasma Glucose Latest Range: <117 mg/dL 378 (H)  BUN Latest Range: 6-23 mg/dL 20  Creatinine Latest Range: 0.50-1.35 mg/dL 0.73  Calcium Latest Range: 8.4-10.5 mg/dL 9.3  GFR calc non Af Amer Latest Range: >90 mL/min >90  GFR calc Af Amer Latest Range: >90 mL/min >90  Glucose Latest Range: 70-99 mg/dL 289 (H)  New onset DM. RN to begin teaching insulin administration.  Discussed diagnosis of DM, effects of diet, exercise and stress on glycemic control, hypoglycemia s/s and treatment, importance of monitoring blood sugars and f/u with PCP to manage DM. Encouraged pt to view diabetes videos on pt ed channel. Answered questions and pt appears to be  motivated to make changes with diet and exercise.  Inpatient Diabetes Program Recommendations Insulin - Basal: Increase Lantus to 15 units bid  Insulin - Meal Coverage: Add Novolog 6 units tidwc for meal coverage insulin if pt eats >50% meal HgbA1C: 14.8% - uncontrolled Outpatient Referral: Will order OP Diabetes Education consult for new onset DM  Note: Will need prescription for meter, strips and lancets at discharge.  Will order insulin starter kit.  Thank you. Lorenda Peck, RD, LDN, CDE Inpatient Diabetes Coordinator [phone]

## 2013-10-09 NOTE — Progress Notes (Signed)
Inpatient Diabetes Program Recommendations  AACE/ADA: New Consensus Statement on Inpatient Glycemic Control (2013)  Target Ranges:  Prepandial:   less than 140 mg/dL      Peak postprandial:   less than 180 mg/dL (1-2 hours)      Critically ill patients:  140 - 180 mg/dL   Reason for Assessment:  New onset DM  Results for Lee Adams, Lee Adams (MRN [id number]) as of 10/09/2013 15:06  Ref. Range 10/08/2013 14:15 10/08/2013 17:35 10/08/2013 21:21 10/09/2013 06:44 10/09/2013 11:19  Glucose-Capillary Latest Range: 70-99 mg/dL 409159 (H) 811186 (H) 914288 (H) 301 (H) 296 (H)  Results for Lee Adams, Lee Adams (MRN [id number]) as of 10/09/2013 15:06  Ref. Range 10/09/2013 05:00  Hemoglobin A1C Latest Range: <5.7 % 14.8 (H)   New-onset DM.  Will need PCP to manage DM after discharge.  Inpatient Diabetes Program Recommendations Insulin - Basal: Increase Lantus to 15 units bid  Insulin - Meal Coverage: Add Novolog 6 units tidwc for meal coverage insulin if pt eats >50% meal HgbA1C: 14.8% - uncontrolled  Will order Living Well With Diabetes book.   RN to encourage pt to view diabetes videos on pt ed channel. Would benefit from OP Diabetes Education consult for newly-diagnosed DM.  Thank you. Ailene Ardshonda Surabhi Gadea, RD, LDN, CDE Inpatient Diabetes Coordinator [phone]

## 2013-10-09 NOTE — Progress Notes (Signed)
Pt education and discharge instructions completed and pt and spouse denies any question. Pt diabetes education completed, watched videos [id number]; pt thought how to administer insulin to self as well spouse also educated. Pt spouse able draw and administer insulin to pt with no difficulties witness. Pt DM starter kit given; pt booklet about DM education also given. Beyond stroke booklet given to pt as well as stroke education booklet. All lines including IV removed from. Pt transported off unit via wheelchair with spouse and belongings at side.

## 2013-10-09 NOTE — Progress Notes (Signed)
  Echocardiogram 2D Echocardiogram with Definity has been performed.  Jacquita Mulhearn 10/09/2013, 10:09 AM

## 2013-10-09 NOTE — Discharge Instructions (Signed)
Read this information   Emergency Department Resource Guide 1) Find a Doctor and Pay Out of Pocket Although you won't have to find out who is covered by your insurance plan, it is a good idea to ask around and get recommendations. You will then need to call the office and see if the doctor you have chosen will accept you as a new patient and what types of options they offer for patients who are self-pay. Some doctors offer discounts or will set up payment plans for their patients who do not have insurance, but you will need to ask so you aren't surprised when you get to your appointment.  2) Contact Your Local Health Department Not all health departments have doctors that can see patients for sick visits, but many do, so it is worth a call to see if yours does. If you don't know where your local health department is, you can check in your phone book. The CDC also has a tool to help you locate your state's health department, and many state websites also have listings of all of their local health departments.  3) Find a Walk-in Clinic If your illness is not likely to be very severe or complicated, you may want to try a walk in clinic. These are popping up all over the country in pharmacies, drugstores, and shopping centers. They're usually staffed by nurse practitioners or physician assistants that have been trained to treat common illnesses and complaints. They're usually fairly quick and inexpensive. However, if you have serious medical issues or chronic medical problems, these are probably not your best option.  No Primary Care Doctor: - Call Health Connect at  [phone] - they can help you locate a primary care doctor that  accepts your insurance, provides certain services, etc. - Physician Referral Service- [phone]  Chronic Pain Problems: Organization         Address  Phone   Notes  [facility]  [phone] Patients need to be referred by their primary care  doctor.   Medication Assistance: Organization         Address  Phone   Notes  [facility] Medication [facility] [address] [place], [place] [address] [phone] --Must be a resident of [facility] -- Must have NO insurance coverage whatsoever (no Medicaid/ Medicare, etc.) -- The pt. MUST have a primary care doctor that directs their care regularly and follows them in the community   [facility]ist  [phone]   [employer]  [phone]    Agencies that provide inexpensive medical care: Organization         Address  Phone   Notes  [facility]  [phone]   [facility]    [phone]   [facility] [address] [place], [place] [address] [phone]   [facility] [address] [place], [place] [phone]   Planned Parenthood    [phone]   [facility]    [phone]   [facility]  [address], [place] Phone:  [phone], Fax:  [phone] Hours of Operation:  9 am - 6 pm, M-F.  Also accepts Medicaid/Medicare and self-pay.  [facility]  [address], [place] Phone: [phone], Fax: [phone]. Hours of Operation:  8:30 am - 5:30 pm, M-F.  Also accepts Medicaid and  self-pay.  [facility] [place] [address], [place] Phone: [phone]) [phone]   [facility] [address], [place], [place] [phone], Ext. [phone] Mondays & Thursdays: 7-9 AM.  First 15 patients are seen on a first come, first serve basis.    Medicaid-accepting [facility] Providers:  Organization         Address  Phone   Notes  [facility] [address], [place] [phone] Also accepts self-pay patients.  [facility] [address], [place]  [phone]) [phone]   [facility] [address], [place] [phone]) [phone]   [facility] [address] [place] [address], [place] [phone]   Renaye RakersVeita Bland [address], [place]   [phone] Only accepts [place] Access [place] patients after they have their name applied to their card.   Self-Pay (no insurance) in [facility]:  Organization         Address  Phone   Notes  Sickle Cell Patients, [facility] [address], [place] [phone]   [facility] [address], [place] [phone]) [phone]   [facility]  [address], [place] [phone]   [facility]/Dr. Osei-Bonsu  [address] [place] [address], [place] or [phone], [place] [phone]) [phone] Phone number for both [place] and [place] locations is the same.  [facility] [address], [place] [phone]) [phone]   [facility] [address] [place] [address], [place] or [address] [phone](336) [phone]   [facility] [address], [place] [phone], phone; [phone]) [phone], fax Sees patients 1st and 3rd Saturday of every month.  Must not qualify for public or private insurance (i.e. Medicaid, Medicare, [place] Health Choice, Veterans' Benefits)  Household income should be no more than 200% of the poverty level The clinic cannot treat you if you are pregnant or think you are pregnant  Sexually transmitted diseases are not treated at the clinic.    Dental Care: Organization         Address  Phone  Notes  [facility] of [facility] [address], [place] [phone] Accepts children up to age 58 who are enrolled in [place] or [place] Health Choice; pregnant women with a Medicaid card; and children who have applied for Medicaid or [place] Health Choice, but were declined, whose parents can pay a reduced fee at time of  service.  [facility] [place]  [address], [place] [phone]) [phone] Accepts children up to age 58 who are enrolled in [place] or [place] Health Choice; pregnant women with a Medicaid card; and children who have applied for Medicaid or  Health Choice, but were declined, whose parents can pay a reduced fee at time of service.  [facility]  [address], [place] [phone]) [phone] Patients are seen by appointment only. Walk-ins are not accepted. [facility] will see patients 58 years of age and older. Monday - Tuesday (8am-5pm) Most Wednesdays (8:30-5pm) $30 per visit, cash only  [facility]  [address], [facility] [phone] Patients are seen by appointment only. Walk-ins are not accepted. [facility] will see patients 7818  years of age and older. One Wednesday Evening (Monthly: Volunteer Based).  $30 per visit, cash only  [employer]  [phone] for adults; Children under age 39, call Graduate Pediatric Dentistry at [phone]. Children aged 87-14, please call [phone] to request a pediatric application.  Dental services are provided in all areas of dental care including fillings, crowns and bridges, complete and partial dentures, implants, gum treatment, root canals, and extractions. Preventive care is also provided. Treatment is provided to both adults and children. Patients are selected via a lottery and there is often a waiting list.   [facility] [address] [place]  [phone] [email or web address]   [facility] [address] [place], [place] [phone], Ext. [phone] Second and Fourth Thursday of each month, opens at 6:30 AM; Clinic ends at 9 AM.  Patients are seen on a first-come first-served basis, and a limited number are seen during each clinic.   [facility]  [address] [place],  [place] [phone]   Eligibility Requirements You must have lived in [place], [place], or [place] counties for at least the last three months.   You cannot be eligible for state or federal sponsored [place], including [employer], [place], or [employer].   You generally cannot be eligible for healthcare insurance through your employer.    How to apply: Eligibility screenings are held every Tuesday and Wednesday afternoon from 1:00 pm until 4:00 pm. You do not need an appointment for the interview!  [facility] [address] [place], [place] [phone]   [facility] Health Department  [phone]   [facility] Health Department  [phone]   [facility] Health Department  [phone]    Behavioral Health Resources in the Community: Intensive Outpatient Programs Organization         Address  Phone  Notes  [facility] [address] [place], [place] [phone]   [facility] Outpatient [address], [place], [place] [phone]   [facility] [address] [place], [place]  [phone]   [facility] Mental Health [address],  [place], [place] [phone] or [phone]   Substance Abuse Resources Organization         Address  Phone  Notes  [facility]  [phone]   [facility]  [phone]   [facility] [place]  [phone]   Floydene Flock  [phone]   Residential & Outpatient Substance Abuse Program  [phone]   Psychological Services Organization         Address  Phone  Notes  [facility]  [phone]   [facility]  [phone]   [facility] Mental Health [address], [place] [phone] or [phone]    Mobile Crisis Teams Organization         Address  Phone  Notes  [facility], Mobile Crisis Care Unit  [phone]   [facility]  [address] [place], [place] [phone]   Doristine Locks [address] [place] [phone]    Self-Help/Support Groups Organization         Address  Phone             Notes  [facility] [place] - variety of support groups  [phone]- I7437963 Call for more information  Narcotics Anonymous (NA), [facility] [address], [place]  2 meetings at this location   Residential Treatment Programs Organization         Address  Phone  Notes  [facility] [address]    [place]  [phone]   [facility]  [address], [place] [address], [place], [place] [phone]   [facility] [address] [place], [place] [phone] Admissions: 8am-3pm M-F  [facility] [address]    [place], [place] [phone]   [facility] [address] [place], [place], [place] [phone]   [facility] [address]  [place], [place] [phone]   [facility] - Intensive Outpatient [address], [place], [place] [phone]   [facility] ([facility] [address]  [place], [place] [phone] or [phone]   [facility] ([facility]) [address], [place], [place] [phone] Accepts Medicaid  [facility] [address]  [place] [phone] Substance Abuse/Addiction Treatment   [facility] Organization         Address  Phone  Notes  [facility]  [phone]   Angie Fava, PhD [address] [place], [place]   [phone] or [phone]   [facility] Behavioral   [address] [place], [place] [phone]   [facility] [address] [place], [place] [phone] Insurance/Medicaid/sponsorship through [facility] [address]                                    [place], [place] [phone]  Therapy/tele-psych/case  [facility] [address], [place] [phone]    Dr. Lolly Mustache  [phone]   [facility] [place]  [employer] [facility] Dept. 1) [address], [place] 2) [address], [place] 3)  [address], [place] [phone]  [phone]   [facility] Child Abuse Hotline 321-797-1201 or [phone] (After Hours)       Stroke Prevention Some medical conditions and behaviors are associated with an increased chance of having a stroke. You may prevent a stroke by making healthy choices and managing medical conditions. Reduce your risk of having a stroke by:  Staying physically active. Get at least 30 minutes of activity on most or all days.  Not smoking. It may also be helpful to avoid exposure to secondhand smoke.  Limiting alcohol use. Moderate alcohol use is considered to be:  No more than 2 drinks per day for men.  No more than 1 drink per day for nonpregnant women.  Eating healthy foods.  Include 5 or more servings of fruits and vegetables a day.  Certain diets may be prescribed to address high blood pressure, high cholesterol, diabetes, or obesity.  Managing your cholesterol levels.  A low-saturated fat, low-trans fat, low-cholesterol, and high-fiber diet may control cholesterol levels.  Take any prescribed medicines to control cholesterol as directed by your caregiver.  Managing your diabetes.  A controlled-carbohydrate, controlled-sugar diet is recommended to manage diabetes.  Take any prescribed medicines to control diabetes as directed by your caregiver.  Controlling your high blood pressure (hypertension).  A low-salt (sodium), low-saturated fat, low-trans fat, and low-cholesterol diet is recommended to manage high blood pressure.  Take any prescribed medicines to control hypertension as  directed by your caregiver.  Maintaining a healthy weight.  A  reduced-calorie, low-sodium, low-saturated fat, low-trans fat, low-cholesterol diet is recommended to manage weight.  Stopping drug abuse.  Avoiding birth control pills.  Talk to your caregiver about the risks of taking birth control pills if you are over 58 years old, smoke, get migraines, or have ever had a blood clot.  Getting evaluated for sleep disorders (sleep apnea).  Talk to your caregiver about getting a sleep evaluation if you snore a lot or have excessive sleepiness.  Taking medicines as directed by your caregiver.  For some people, aspirin or blood thinners (anticoagulants) are helpful in reducing the risk of forming abnormal blood clots that can lead to stroke. If you have the irregular heart rhythm of atrial fibrillation, you should be on a blood thinner unless there is a good reason you cannot take them.  Understand all your medicine instructions. SEEK IMMEDIATE MEDICAL CARE IF:   You have sudden weakness or numbness of the face, arm, or leg, especially on one side of the body.  You have sudden confusion.  You have trouble speaking (aphasia) or understanding.  You have sudden trouble seeing in one or both eyes.  You have sudden trouble walking.  You have dizziness.  You have a loss of balance or coordination.  You have a sudden, severe headache with no known cause.  You have new chest pain or an irregular heartbeat. Any of these symptoms may represent a serious problem that is an emergency. Do not wait to see if the symptoms will go away. Get medical help right away. Call your local emergency services (911 in U.S.). Do not drive yourself to the hospital. Document Released: 11/01/2004 Document Revised: 12/17/2011 Document Reviewed: 03/27/2013 [facility] Patient Information 2014 [place], [place].  Fatty Liver Fatty liver is the accumulation of fat in liver cells. It is also called hepatosteatosis or steatohepatitis. It is normal for your liver to contain some fat.  If fat is more than 5 to 10% of your liver's weight, you have fatty liver.  There are often no symptoms (problems) for years while damage is still occurring. People often learn about their fatty liver when they have medical tests for other reasons. Fat can damage your liver for years or even decades without causing problems. When it becomes severe, it can cause fatigue, weight loss, weakness, and confusion. This makes you more likely to develop more serious liver problems. The liver is the largest organ in the body. It does a lot of work and often gives no warning signs when it is sick until late in a disease. The liver has many important jobs including:  Breaking down foods.  Storing vitamins, iron, and other minerals.  Making proteins.  Making bile for food digestion.  Breaking down many products including medications, alcohol and some poisons. CAUSES  There are a number of different conditions, medications, and poisons that can cause a fatty liver. Eating too many calories causes fat to build up in the liver. Not processing and breaking fats down normally may also cause this. Certain conditions, such as obesity, diabetes, and high triglycerides also cause this. Most fatty liver patients tend to be middle-aged and over weight.  Some causes of fatty liver are:  Alcohol over consumption.  Malnutrition.  Steroid use.  Valproic acid toxicity.  Obesity.  Cushing's syndrome.  Poisons.  Tetracycline in high dosages.  Pregnancy.  Diabetes.  Hyperlipidemia.  Rapid weight loss. Some people develop fatty liver even having  none of these conditions. SYMPTOMS  Fatty liver most often causes no problems. This is called asymptomatic.  It can be diagnosed with blood tests and also by a liver biopsy.  It is one of the most common causes of minor elevations of liver enzymes on routine blood tests.  Specialized Imaging of the liver using ultrasound, CT (computed tomography) scan, or MRI  (magnetic resonance imaging) can suggest a fatty liver but a biopsy is needed to confirm it.  A biopsy involves taking a small sample of liver tissue. This is done by using a needle. It is then looked at under a microscope by a specialist. TREATMENT  It is important to treat the cause. Simple fatty liver without a medical reason may not need treatment.  Weight loss, fat restriction, and exercise in overweight patients produces inconsistent results but is worth trying.  Fatty liver due to alcohol toxicity may not improve even with stopping drinking.  Good control of diabetes may reduce fatty liver.  Lower your triglycerides through diet, medication or both.  Eat a balanced, healthy diet.  Increase your physical activity.  Get regular checkups from a liver specialist.  There are no medical or surgical treatments for a fatty liver or NASH, but improving your diet and increasing your exercise may help prevent or reverse some of the damage. PROGNOSIS  Fatty liver may cause no damage or it can lead to an inflammation of the liver. This is, called steatohepatitis. When it is linked to alcohol abuse, it is called alcoholic steatohepatitis. It often is not linked to alcohol. It is then called nonalcoholic steatohepatitis, or NASH. Over time the liver may become scarred and hardened. This condition is called cirrhosis. Cirrhosis is serious and may lead to liver failure or cancer. NASH is one of the leading causes of cirrhosis. About 10-20% of Americans have fatty liver and a smaller 2-5% has NASH. Document Released: 11/09/2005 Document Revised: 12/17/2011 Document Reviewed: 01/02/2006 [facility] Patient Information 2014 [place], [place].  Cholesterol Cholesterol is a white, waxy, fat-like protein needed by your body in small amounts. The liver makes all the cholesterol you need. It is carried from the liver by the blood through the blood vessels. Deposits (plaque) may build up on blood vessel walls.  This makes the arteries narrower and stiffer. Plaque increases the risk for heart attack and stroke. You cannot feel your cholesterol level even if it is very high. The only way to know is by a blood test to check your lipid (fats) levels. Once you know your cholesterol levels, you should keep a record of the test results. Work with your caregiver to to keep your levels in the desired range. WHAT THE RESULTS MEAN:  Total cholesterol is a rough measure of all the cholesterol in your blood.  LDL is the so-called bad cholesterol. This is the type that deposits cholesterol in the walls of the arteries. You want this level to be low.  HDL is the good cholesterol because it cleans the arteries and carries the LDL away. You want this level to be high.  Triglycerides are fat that the body can either burn for energy or store. High levels are closely linked to heart disease. DESIRED LEVELS:  Total cholesterol below 200.  LDL below 100 for people at risk, below 70 for very high risk.  HDL above 50 is good, above 60 is best.  Triglycerides below 150. HOW TO LOWER YOUR CHOLESTEROL:  Diet.  Choose fish or white meat chicken and [place],  roasted or baked. Limit fatty cuts of red meat, fried foods, and processed meats, such as sausage and lunch meat.  Eat lots of fresh fruits and vegetables. Choose whole grains, beans, pasta, potatoes and cereals.  Use only small amounts of olive, corn or canola oils. Avoid butter, mayonnaise, shortening or palm kernel oils. Avoid foods with trans-fats.  Use skim/nonfat milk and low-fat/nonfat yogurt and cheeses. Avoid whole milk, cream, ice cream, egg yolks and cheeses. Healthy desserts include angel food cake, ginger snaps, animal crackers, hard candy, popsicles, and low-fat/nonfat frozen yogurt. Avoid pastries, cakes, pies and cookies.  Exercise.  A regular program helps decrease LDL and raises HDL.  Helps with weight control.  Do things that increase your  activity level like gardening, walking, or taking the stairs.  Medication.  May be prescribed by your caregiver to help lowering cholesterol and the risk for heart disease.  You may need medicine even if your levels are normal if you have several risk factors. HOME CARE INSTRUCTIONS   Follow your diet and exercise programs as suggested by your caregiver.  Take medications as directed.  Have blood work done when your caregiver feels it is necessary. MAKE SURE YOU:   Understand these instructions.  Will watch your condition.  Will get help right away if you are not doing well or get worse. Document Released: 06/19/2001 Document Revised: 12/17/2011 Document Reviewed: 12/10/2007 [facility] Patient Information 2014 [place], [place].  Type 2 Diabetes Mellitus, Adult Type 2 diabetes mellitus, often simply referred to as type 2 diabetes, is a long-lasting (chronic) disease. In type 2 diabetes, the pancreas does not make enough insulin (a hormone), the cells are less responsive to the insulin that is made (insulin resistance), or both. Normally, insulin moves sugars from food into the tissue cells. The tissue cells use the sugars for energy. The lack of insulin or the lack of normal response to insulin causes excess sugars to build up in the blood instead of going into the tissue cells. As a result, high blood sugar (hyperglycemia) develops. The effect of high sugar (glucose) levels can cause many complications. Type 2 diabetes was also previously called adult-onset diabetes but it can occur at any age.  RISK FACTORS  A person is predisposed to developing type 2 diabetes if someone in the family has the disease and also has one or more of the following primary risk factors:  Overweight.  An inactive lifestyle.  A history of consistently eating high-calorie foods. Maintaining a normal weight and regular physical activity can reduce the chance of developing type 2 diabetes. SYMPTOMS  A  person with type 2 diabetes may not show symptoms initially. The symptoms of type 2 diabetes appear slowly. The symptoms include:  Increased thirst (polydipsia).  Increased urination (polyuria).  Increased urination during the night (nocturia).  Weight loss. This weight loss may be rapid.  Frequent, recurring infections.  Tiredness (fatigue).  Weakness.  Vision changes, such as blurred vision.  Fruity smell to your breath.  Abdominal pain.  Nausea or vomiting.  Cuts or bruises which are slow to heal.  Tingling or numbness in the hands or feet. DIAGNOSIS Type 2 diabetes is frequently not diagnosed until complications of diabetes are present. Type 2 diabetes is diagnosed when symptoms or complications are present and when blood glucose levels are increased. Your blood glucose level may be checked by one or more of the following blood tests:  A fasting blood glucose test. You will not be allowed to eat for  at least 8 hours before a blood sample is taken.  A random blood glucose test. Your blood glucose is checked at any time of the day regardless of when you ate.  A hemoglobin A1c blood glucose test. A hemoglobin A1c test provides information about blood glucose control over the previous 3 months.  An oral glucose tolerance test (OGTT). Your blood glucose is measured after you have not eaten (fasted) for 2 hours and then after you drink a glucose-containing beverage. TREATMENT   You may need to take insulin or diabetes medicine daily to keep blood glucose levels in the desired range.  You will need to match insulin dosing with exercise and healthy food choices. The treatment goal is to maintain the before meal blood sugar (preprandial glucose) level at 70 130 mg/dL. HOME CARE INSTRUCTIONS   Have your hemoglobin A1c level checked twice a year.  Perform daily blood glucose monitoring as directed by your caregiver.  Monitor urine ketones when you are ill and as directed by  your caregiver.  Take your diabetes medicine or insulin as directed by your caregiver to maintain your blood glucose levels in the desired range.  Never run out of diabetes medicine or insulin. It is needed every day.  Adjust insulin based on your intake of carbohydrates. Carbohydrates can raise blood glucose levels but need to be included in your diet. Carbohydrates provide vitamins, minerals, and fiber which are an essential part of a healthy diet. Carbohydrates are found in fruits, vegetables, whole grains, dairy products, legumes, and foods containing added sugars.    Eat healthy foods. Alternate 3 meals with 3 snacks.  Lose weight if overweight.  Carry a medical alert card or wear your medical alert jewelry.  Carry a 15 gram carbohydrate snack with you at all times to treat low blood glucose (hypoglycemia). Some examples of 15 gram carbohydrate snacks include:  Glucose tablets, 3 or 4   Glucose gel, 15 gram tube  Raisins, 2 tablespoons (24 grams)  Jelly beans, 6  Animal crackers, 8  Regular pop, 4 ounces (120 mL)  Gummy treats, 9  Recognize hypoglycemia. Hypoglycemia occurs with blood glucose levels of 70 mg/dL and below. The risk for hypoglycemia increases when fasting or skipping meals, during or after intense exercise, and during sleep. Hypoglycemia symptoms can include:  Tremors or shakes.  Decreased ability to concentrate.  Sweating.  Increased heart rate.  Headache.  Dry mouth.  Hunger.  Irritability.  Anxiety.  Restless sleep.  Altered speech or coordination.  Confusion.  Treat hypoglycemia promptly. If you are alert and able to safely swallow, follow the 15:15 rule:  Take 15 20 grams of rapid-acting glucose or carbohydrate. Rapid-acting options include glucose gel, glucose tablets, or 4 ounces (120 mL) of fruit juice, regular soda, or low fat milk.  Check your blood glucose level 15 minutes after taking the glucose.  Take 15 20 grams more  of glucose if the repeat blood glucose level is still 70 mg/dL or below.  Eat a meal or snack within 1 hour once blood glucose levels return to normal.    Be alert to polyuria and polydipsia which are early signs of hyperglycemia. An early awareness of hyperglycemia allows for prompt treatment. Treat hyperglycemia as directed by your caregiver.  Engage in at least 150 minutes of moderate-intensity physical activity a week, spread over at least 3 days of the week or as directed by your caregiver. In addition, you should engage in resistance exercise at least 2 times  a week or as directed by your caregiver.  Adjust your medicine and food intake as needed if you start a new exercise or sport.  Follow your sick day plan at any time you are unable to eat or drink as usual.  Avoid tobacco use.  Limit alcohol intake to no more than 1 drink per day for nonpregnant women and 2 drinks per day for men. You should drink alcohol only when you are also eating food. Talk with your caregiver whether alcohol is safe for you. Tell your caregiver if you drink alcohol several times a week.  Follow up with your caregiver regularly.  Schedule an eye exam soon after the diagnosis of type 2 diabetes and then annually.  Perform daily skin and foot care. Examine your skin and feet daily for cuts, bruises, redness, nail problems, bleeding, blisters, or sores. A foot exam by a caregiver should be done annually.  Brush your teeth and gums at least twice a day and floss at least once a day. Follow up with your dentist regularly.  Share your diabetes management plan with your workplace or school.  Stay up-to-date with immunizations.  Learn to manage stress.  Obtain ongoing diabetes education and support as needed.  Participate in, or seek rehabilitation as needed to maintain or improve independence and quality of life. Request a physical or occupational therapy referral if you are having foot or hand numbness or  difficulties with grooming, dressing, eating, or physical activity. SEEK MEDICAL CARE IF:   You are unable to eat food or drink fluids for more than 6 hours.  You have nausea and vomiting for more than 6 hours.  Your blood glucose level is over 240 mg/dL.  There is a change in mental status.  You develop an additional serious illness.  You have diarrhea for more than 6 hours.  You have been sick or have had a fever for a couple of days and are not getting better.  You have pain during any physical activity.  SEEK IMMEDIATE MEDICAL CARE IF:  You have difficulty breathing.  You have moderate to large ketone levels. MAKE SURE YOU:  Understand these instructions.  Will watch your condition.  Will get help right away if you are not doing well or get worse. Document Released: 09/24/2005 Document Revised: 06/18/2012 Document Reviewed: 04/22/2012 [facility] Patient Information 2014 [place], [place].  Diabetes, Eating Away From Home Sometimes, you might eat in a restaurant or have meals that are prepared by someone else. You can enjoy eating out. However, the portions in restaurants may be much larger than needed. Listed below are some ideas to help you choose foods that will keep your blood glucose (sugar) in better control.  TIPS FOR EATING OUT Know your meal plan and how many carbohydrate servings you should have at each meal. You may wish to carry a copy of your meal plan in your purse or wallet. Learn the foods included in each food group. Make a list of restaurants near you that offer healthy choices. Take a copy of the carry-out menus to see what they offer. Then, you can plan what you will order ahead of time. Become familiar with serving sizes by practicing them at home using measuring cups and spoons. Once you learn to recognize portion sizes, you will be able to correctly estimate the amount of total carbohydrate you are allowed to eat at the restaurant. Ask for a takeout box if  the portion is more than you should have. When  your food comes, leave the amount you should have on the plate, and put the rest in the takeout box before you start eating. Plan ahead if your mealtime will be different from usual. Check with your caregiver to find out how to time meals and medicine if you are taking insulin. Avoid high-fat foods, such as fried foods, cream sauces, high-fat salad dressings, or any added butter or margarine. Do not be afraid to ask questions. Ask your server about the portion size, cooking methods, ingredients and if items can be substituted. Restaurants do not list all available items on the menu. You can ask for your main entree to be prepared using skim milk, oil instead of butter or margarine, and without gravy or sauces. Ask your waiter or waitress to serve salad dressings, gravy, sauces, margarine, and sour cream on the side. You can then add the amount your meal plan suggests. Add more vegetables whenever possible. Avoid items that are labeled "jumbo," "giant," "deluxe," or "supersized." You may want to split an entre with someone and order an extra side salad. Watch for hidden calories in foods like croutons, bacon, or cheese. Ask your server to take away the bread basket or chips from your table. Order a dinner salad as an appetizer. You can eat most foods served in a restaurant. Some foods are better choices than others. Breads and Starches Recommended: All kinds of bread (wheat, rye, white, oatmeal, [place], [place], raisin), hard or soft dinner rolls, frankfurter or hamburger buns, small bagels, small corn or whole-wheat flour tortillas. Avoid: Frosted or glazed breads, butter rolls, egg or cheese breads, croissants, sweet rolls, pastries, coffee cake, glazed or frosted doughnuts, muffins. Crackers Recommended: Animal crackers, graham, rye, saltine, oyster, and matzoth crackers. Bread sticks, melba toast, rusks, pretzels, popcorn (without fat), zwieback  toast. Avoid: High-fat snack crackers or chips. Buttered popcorn. Cereals Recommended: Hot and cold cereals. Whole grains such as oatmeal or shredded wheat are good choices. Avoid: Sugar-coated or granola type cereals. Potatoes/Pasta/Rice/Beans Recommended: Order baked, boiled, or mashed potatoes, rice or noodles without added fat, whole beans. Order gravies, butter, margarine, or sauces on the side so you can control the amount you add. Avoid: Hash browns or fried potatoes. Potatoes, pasta, or rice prepared with cream or cheese sauce. Potato or pasta salads prepared with large amounts of dressing. Fried beans or fried rice. Vegetables Recommended: Order steamed, baked, boiled, or stewed vegetables without sauces or extra fat. Ask that sauce be served on the side. If vegetables are not listed on the menu, ask what is available. Avoid: Vegetables prepared with cream, butter, or cheese sauce. Fried vegetables. Salad Bars Recommended: Many of the vegetables at a salad bar are considered "free." Use lemon juice, vinegar, or low-calorie salad dressing (fewer than 20 calories per serving) as "free" dressings for your salad. Look for salad bar ingredients that have no added fat or sugar such as tomatoes, lettuce, cucumbers, broccoli, carrots, onions, and mushrooms. Avoid: Prepared salads with large amounts of dressing, such as coleslaw, caesar salad, macaroni salad, bean salad, or carrot salad. Fruit Recommended: Eat fresh fruit or fresh fruit salad without added dressing. A salad bar often offers fresh fruit choices, but canned fruit at a restaurant is usually packed in sugar or syrup. Avoid: Sweetened canned or frozen fruits, plain or sweetened fruit juice. Fruit salads with dressing, sour cream, or sugar added to them. Meat and Meat Substitutes Recommended: Order broiled, baked, roasted, or grilled meat, poultry, or fish. Trim off all visible  fat. Do not eat the skin of poultry. The size stated on the  menu is the raw weight. Meat shrinks by  in cooking (for example, 4 oz raw equals 3 oz cooked meat). Avoid: Deep-fat fried meat, poultry, or fish. Breaded meats. Eggs Recommended: Order soft, hard-cooked, poached, or scrambled eggs. Omelets may be okay, depending on what ingredients are added. Egg substitutes are also a good choice. Avoid: Fried eggs, eggs prepared with cream or cheese sauce. Milk Recommended: Order low-fat or fat-free milk according to your meal plan. Plain, nonfat yogurt or flavored yogurt with no sugar added may be used as a substitute for milk. Soy milk may also be used. Avoid: Milk shakes or sweetened milk beverages. Soups and Combination Foods Recommended: Clear broth or consomm are "free" foods and may be used as an appetizer. Broth-based soups with fat removed count as a starch serving and are preferred over cream soups. Soups made with beans or split peas may be eaten but count as a starch. Avoid: Fatty soups, soup made with cream, cheese soup. Combination foods prepared with excessive amounts of fat or with cream or cheese sauces. Desserts and Sweets Recommended: Ask for fresh fruit. Sponge or angel food cake without icing, ice milk, no sugar added ice cream, sherbet, or frozen yogurt may fit into your meal plan occasionally. Avoid: Pastries, puddings, pies, cakes with icing, custard, gelatin desserts. Fats and Oils Recommended: Choose healthy fats such as olive oil, canola oil, or tub margarine, reduced fat or fat-free sour cream, cream cheese, avocado, or nuts. Avoid: Any fats in excess of your allowed portion. Deep-fried foods or any food with a large amount of fat. Note: Ask for all fats to be served on the side, and limit your portion sizes according to your meal plan. Document Released: 09/24/2005 Document Revised: 12/17/2011 Document Reviewed: 04/14/2009 [facility] Patient Information 2014 [place], [place].  Diabetes and Exercise Exercising regularly is  important. It is not just about losing weight. It has many health benefits, such as:  Improving your overall fitness, flexibility, and endurance.  Increasing your bone density.  Helping with weight control.  Decreasing your body fat.  Increasing your muscle strength.  Reducing stress and tension.  Improving your overall health. People with diabetes who exercise gain additional benefits because exercise:  Reduces appetite.  Improves the body's use of blood sugar (glucose).  Helps lower or control blood glucose.  Decreases blood pressure.  Helps control blood lipids (such as cholesterol and triglycerides).  Improves the body's use of the hormone insulin by:  Increasing the body's insulin sensitivity.  Reducing the body's insulin needs.  Decreases the risk for heart disease because exercising:  Lowers cholesterol and triglycerides levels.  Increases the levels of good cholesterol (such as high-density lipoproteins [HDL]) in the body.  Lowers blood glucose levels. YOUR ACTIVITY PLAN  Choose an activity that you enjoy and set realistic goals. Your health care provider or diabetes educator can help you make an activity plan that works for you. You can break activities into 2 or 3 sessions throughout the day. Doing so is as good as one long session. Exercise ideas include:  Taking the dog for a walk.  Taking the stairs instead of the elevator.  Dancing to your favorite song.  Doing your favorite exercise with a friend. RECOMMENDATIONS FOR EXERCISING WITH TYPE 1 OR TYPE 2 DIABETES   Check your blood glucose before exercising. If blood glucose levels are greater than 240 mg/dL, check for urine ketones. Do  not exercise if ketones are present.  Avoid injecting insulin into areas of the body that are going to be exercised. For example, avoid injecting insulin into:  The arms when playing tennis.  The legs when jogging.  Keep a record of:  Food intake before and after  you exercise.  Expected peak times of insulin action.  Blood glucose levels before and after you exercise.  The type and amount of exercise you have done.  Review your records with your health care provider. Your health care provider will help you to develop guidelines for adjusting food intake and insulin amounts before and after exercising.  If you take insulin or oral hypoglycemic agents, watch for signs and symptoms of hypoglycemia. They include:  Dizziness.  Shaking.  Sweating.  Chills.  Confusion.  Drink plenty of water while you exercise to prevent dehydration or heat stroke. Body water is lost during exercise and must be replaced.  Talk to your health care provider before starting an exercise program to make sure it is safe for you. Remember, almost any type of activity is better than none. Document Released: 12/15/2003 Document Revised: 05/27/2013 Document Reviewed: 03/03/2013 [facility] Patient Information 2014 [place], [place].  Ischemic Stroke A stroke (cerebrovascular accident) is the sudden death of brain tissue. It is a medical emergency. A stroke can cause permanent loss of brain function. This can cause problems with different parts of your body. A transient ischemic attack (TIA) is different because it does not cause permanent damage. A TIA is a short-lived problem of poor blood flow affecting a part of the brain. A TIA is also a serious problem because having a TIA greatly increases the chances of having a stroke. When symptoms first develop, you cannot know if the problem might be a stroke or TIA. CAUSES  A stroke is caused by a decrease of oxygen supply to an area of your brain. It is usually the result of a small blood clot or collection of cholesterol or fat (plaque) that blocks blood flow in the brain. A stroke can also be caused by blocked or damaged carotid arteries.  RISK FACTORS  High blood pressure (hypertension).  High cholesterol.  Diabetes  mellitus.  Heart disease.  The build up of plaque in the blood vessels (peripheral artery disease or atherosclerosis).  The build up of plaque in the blood vessels providing blood and oxygen to the brain (carotid artery stenosis).  An abnormal heart rhythm (atrial fibrillation).  Obesity.  Smoking.  Taking oral contraceptives (especially in combination with smoking).  Physical inactivity.  A diet high in fats, salt (sodium), and calories.  Alcohol use.  Use of illegal drugs (especially cocaine and methamphetamine).  Being African American.  Being over the age of 66.  Family history of stroke.  Previous history of blood clots, stroke, TIA, or heart attack.  Sickle cell disease. SYMPTOMS  These symptoms usually develop suddenly, or may be newly present upon awakening from sleep:  Sudden weakness or numbness of the face, arm, or leg, especially on one side of the body.  Sudden trouble walking or difficulty moving arms or legs.  Sudden confusion.  Sudden personality changes.  Trouble speaking (aphasia) or understanding.  Difficulty swallowing.  Sudden trouble seeing in one or both eyes.  Double vision.  Dizziness.  Loss of balance or coordination.  Sudden severe headache with no known cause.  Trouble reading or writing. DIAGNOSIS  Your caregiver can often determine the presence or absence of a stroke based on  your symptoms, history, and physical exam. Computed tomography (CT) of the brain is usually performed to confirm the stroke, determine causes, and determine stroke severity. Other tests may be done to find the cause of the stroke. These tests may include:  Electrocardiography.  Continuous heart monitoring.  Echocardiography.  Carotid ultrasonography.  Magnetic resonance imaging (MRI).  A scan of the brain circulation.  Blood tests. PREVENTION  The risk of a stroke can be decreased by appropriately treating high blood pressure, high  cholesterol, diabetes, heart disease, and obesity and by quitting smoking, limiting alcohol, and staying physically active. TREATMENT  Time is of the essence. It is important to seek treatment within 3 4 hours of the start of symptoms because you may receive a medicine to dissolve the clot (thrombolytic) that cannot be given after that time. Even if you do not know when your symptoms began, get treatment as soon as possible. After the 4 hour window has passed, treatment may include rest, oxygen, intravenous (IV) fluids, and medicines to thin the blood (anticoagulants). Treatment of stroke depends on the duration, severity, and cause of your symptoms. Medicines and diet may be used to address diabetes, high blood pressure, and other risk factors. Physical, speech, and occupational therapists will assess you and work to improve any functions impaired by the stroke. Measures will be taken to prevent short-term and long-term complications, including infection from breathing foreign material into the lungs (aspiration pneumonia), blood clots in the legs, bedsores, and falls. Rarely, surgery may be needed to remove large blood clots or to open up blocked arteries. HOME CARE INSTRUCTIONS   Take all medicines prescribed by your caregiver. Follow the directions carefully. Medicines may be used to control risk factors for a stroke. Be sure you understand all your medicine instructions.  You may be told to take aspirin or the anticoagulant warfarin. Warfarin needs to be taken exactly as instructed.  Too much and too little warfarin are both dangerous. Too much warfarin increases the risk of bleeding. Too little warfarin continues to allow the risk for blood clots. While taking warfarin, you will need to have regular blood tests to measure your blood clotting time. These blood tests usually include both the PT and INR tests. The PT and INR results allow your caregiver to adjust your dose of warfarin. The dose can  change for many reasons. It is critically important that you take warfarin exactly as prescribed, and that you have your PT and INR levels drawn exactly as directed.  Many foods, especially foods high in vitamin K can interfere with warfarin and affect the PT and INR results. Foods high in vitamin K include spinach, kale, broccoli, cabbage, collard and turnip greens, brussels sprouts, peas, cauliflower, seaweed, and parsley as well as beef and pork liver, green tea, and soybean oil. You should eat a consistent amount of foods high in vitamin K. Avoid major changes in your diet, or notify your caregiver before changing your diet. Arrange a visit with a dietitian to answer your questions.  Many medicines can interfere with warfarin and affect the PT and INR results. You must tell your caregiver about any and all medicines you take, this includes all vitamins and supplements. Be especially cautious with aspirin and anti-inflammatory medicines. Do not take or discontinue any prescribed or over-the-counter medicine except on the advice of your caregiver or pharmacist.  Warfarin can have side effects, such as excessive bruising or bleeding. You will need to hold pressure over cuts for longer than  usual. Your caregiver or pharmacist will discuss other potential side effects.  Avoid sports or activities that may cause injury or bleeding.  Be mindful when shaving, flossing your teeth, or handling sharp objects.  Alcohol can change the body's ability to handle warfarin. It is best to avoid alcoholic drinks or consume only very small amounts while taking warfarin. Notify your caregiver if you change your alcohol intake.  Notify your dentist or other caregivers before procedures.  If swallow studies have determined that your swallowing reflex is present, you should eat healthy foods. A diet that includes 5 or more servings of fruits and vegetables a day may reduce the risk of stroke. Foods may need to be a  special consistency (soft or pureed), or small bites may need to be taken in order to avoid aspirating or choking. Certain diets may be prescribed to address high blood pressure, high cholesterol, diabetes, or obesity.  A low-sodium, low-saturated fat, low-trans fat, low-cholesterol diet is recommended to manage high blood pressure.  A low-saturated fat, low-trans fat, low-cholesterol, and high-fiber diet may control cholesterol levels.  A controlled-carbohydrate, controlled-sugar diet is recommended to manage diabetes.  A reduced-calorie, low-sodium, low-saturated fat, low-trans fat, low-cholesterol diet is recommended to manage obesity.  Maintain a healthy weight.  Stay physically active. It is recommended that you get at least 30 minutes of activity on most or all days.  Do not smoke.  Limit alcohol use even if you are not taking warfarin. Moderate alcohol use is considered to be:  No more than 2 drinks each day for men.  No more than 1 drink each day for nonpregnant women.  Stop drug abuse.  Home safety. A safe home environment is important to reduce the risk of falls. Your caregiver may arrange for specialists to evaluate your home. Having grab bars in the bedroom and bathroom is often important. Your caregiver may arrange for equipment to be used at home, such as raised toilets and a seat for the shower.  Physical, occupational, and speech therapy. Ongoing therapy may be needed to maximize your recovery after a stroke. If you have been advised to use a walker or a cane, use it at all times. Be sure to keep your therapy appointments.  Follow all instructions for follow-up with your caregiver. This is very important. This includes any referrals, physical therapy, rehabilitation, and lab tests. Proper follow up can prevent another stroke from occurring. SEEK MEDICAL CARE IF:  You have personality changes.  You have difficulty swallowing.  You are seeing double.  You have  dizziness.  You have a fever.  You have skin breakdown. SEEK IMMEDIATE MEDICAL CARE IF:  Any of these symptoms may represent a serious problem that is an emergency. Do not wait to see if the symptoms will go away. Get medical help right away. Call your local emergency services (911 in [place] Do not drive yourself to the hospital.  You have sudden weakness or numbness of the face, arm, or leg, especially on one side of the body.  You have sudden trouble walking or difficulty moving arms or legs.  You have sudden confusion.  You have trouble speaking (aphasia) or understanding.  You have sudden trouble seeing in one or both eyes.  You have a loss of balance or coordination.  You have a sudden, severe headache with no known cause.  You have new chest pain or an irregular heartbeat.  You have a partial or total loss of consciousness.   Document Released:  09/24/2005 Document Revised: 05/27/2013 Document Reviewed: 05/04/2012 [facility] Patient Information 2014 [place], [place].

## 2013-10-09 NOTE — Clinical Social Work Note (Signed)
CSW received consult for referrals to PCP's for pt. [facility] with [facility]. CSW signing off. Please re consult if needed.  Darlyn ChamberEmily [place], LCSWA Clinical Social Worker [phone]

## 2013-10-09 NOTE — Progress Notes (Signed)
Stroke Team Progress Note  HISTORY Lee Adams is an 58 y.o. male who noted at approximately 4 PM yesterday 10/07/2013 his right arm,  Leg and face felt funny and his right arm was more clumsy than normal. HE did not seek medical attention at that time. Due to awakening this AM 10/08/2013 and symptoms had not resolved [facility] There his blood sugar was 409 and he continued to have right sided weakness. Stroke was suspected and patient was [facility]. On arrival patient obtained a MRI brian which confirmed a left pontine infarct. Patient continues to have right arm and leg weakness along with dysarthria.   Patient was not a TPA candidate secondary to delay in arrival. He was admitted for further evaluation and treatment.  SUBJECTIVE His ?wife is at the bedside.  Overall he feels his condition is rapidly improving. He is sitting up in the chair at the bedside, eating lunch  OBJECTIVE Most recent Vital Signs: Filed Vitals:   10/09/13 0215 10/09/13 0405 10/09/13 0531 10/09/13 1123  BP: 137/59 132/62 170/92 150/90  Pulse: 61 62 62 74  Temp: 97.7 F (36.5 C) 97.5 F (36.4 C) 97.8 F (36.6 C) 98.4 F (36.9 C)  TempSrc: Oral Oral Oral Oral  Resp: 16 18 18 18   Height:      Weight:      SpO2: 96% 97% 95% 98%   CBG (last 3)   Recent Labs  10/08/13 2121 10/09/13 0644 10/09/13 1119  GLUCAP 288* 301* 296*    IV Fluid Intake:     MEDICATIONS  . aspirin EC  81 mg Oral Daily  . atorvastatin  80 mg Oral q1800  . heparin  5,000 Units Subcutaneous Q8H  . [START ON 10/10/2013] influenza vac split quadrivalent PF  0.5 mL Intramuscular Tomorrow-1000  . insulin aspart  0-15 Units Subcutaneous TID WC  . insulin aspart  0-5 Units Subcutaneous QHS  . insulin glargine  10 Units Subcutaneous BID  . sodium chloride  3 mL Intravenous Q12H   PRN:  senna-docusate  Diet:  Carb Control thin liquids Activity:  Up with assistance DVT  Prophylaxis:  Heparin 5000 units sq tid   CLINICALLY SIGNIFICANT STUDIES Basic Metabolic Panel:   Recent Labs Lab 10/08/13 2325 10/09/13 0500  NA 135* 137  K 3.7 4.4  CL 98 98  CO2 26 26  GLUCOSE 263* 289*  BUN 20 20  CREATININE 0.74 0.73  CALCIUM 9.2 9.3  MG  --  1.9   Liver Function Tests:   Recent Labs Lab 10/08/13 1045 10/09/13 0500  AST 114* 120*  ALT 206* 200*  ALKPHOS 67 60  BILITOT 0.6 0.6  PROT 7.5 6.9  ALBUMIN 3.9 3.5   CBC:   Recent Labs Lab 10/08/13 1045 10/08/13 2325  WBC 5.9 7.8  NEUTROABS 3.6  --   HGB 16.3 15.7  HCT 45.4 43.8  MCV 83.6 84.4  PLT 178 181   Coagulation:   Recent Labs Lab 10/08/13 1631  LABPROT 13.1  INR 1.01   Cardiac Enzymes:   Recent Labs Lab 10/08/13 2120 10/09/13 0157 10/09/13 0805  TROPONINI <0.30 <0.30 <0.30   Urinalysis: No results found for this basename: COLORURINE, APPERANCEUR, LABSPEC, PHURINE, GLUCOSEU, HGBUR, BILIRUBINUR, KETONESUR, PROTEINUR, UROBILINOGEN, NITRITE, LEUKOCYTESUR,  in the last 168 hours Lipid Panel    Component Value Date/Time   CHOL 244* 10/09/2013 0500   TRIG 206* 10/09/2013 0500   HDL 33*  10/09/2013 0500   CHOLHDL 7.4 10/09/2013 0500   VLDL 41* 10/09/2013 0500   LDLCALC 170* 10/09/2013 0500   HgbA1C  Lab Results  Component Value Date   HGBA1C 14.8* 10/09/2013    Urine Drug Screen:   No results found for this basename: labopia,  cocainscrnur,  labbenz,  amphetmu,  thcu,  labbarb    Alcohol Level:   Recent Labs Lab 10/08/13 1631  ETH <11    US Abdomen Complete   10/09/2013    1. Prior cholecystectomy. Common bile duct measures 9.3 mm. This slight dilatation is most likely from prior cholecystectomy. Correlate with liver/biliary function tests. 2. Liver demonstrates coarse echotexture. This can be seen with cirrhosis and/or fatty infiltration. Portal vein is patent with normal directional flow. 3. Coarse hepatic     CT of the brain  10/08/2013    No acute intracranial findings.    MRI of the brain  10/08/2013   1 cm acute infarction affecting the left pons.  Mild chronic small-vessel changes affecting the cerebral hemispheric deep white matter.  Advanced intracranial atherosclerotic disease, most severe in the left MCA territory  MRA of the brain  10/08/2013   Severe stenosis at the left MCA bifurcation could place the patient at risk of left MCA territory infarction in the future.  Atherosclerotic irregularity of the posterior circulation branch vessels without dominant or correctable stenosis.  2D Echocardiogram  EF 50% with no source of embolus.   Carotid Doppler  10/08/2013    1. No radiographic evidence of acute cardiopulmonary disease. 2. 2 small nodular opacities projecting over the right mid lung noted only on the frontal projection.   CXR  10/08/2013    1. No radiographic evidence of acute cardiopulmonary disease. 2. 2 small nodular opacities projecting over the right mid lung noted only on the frontal projection. Repeat standing PA and lateral chest x-ray in 2-3 months is recommended to ensure the stability or resolution of these findings.    EKG  normal sinus rhythm.   Therapy Recommendations OP PT  Physical Exam   Mental Status:  Alert, oriented, thought content appropriate. Speech dysarthric without evidence of aphasia. Able to follow 3 step commands without difficulty.  Cranial Nerves:  II: Discs flat bilaterally; Visual fields grossly normal, pupils equal, round, reactive to light and accommodation  III,IV, VI: ptosis not present, extra-ocular motions intact bilaterally  V,VII: smile symmetric, facial light touch sensation normal bilaterally  VIII: hearing normal bilaterally  IX,X: gag reflex present  XI: bilateral shoulder shrug  XII: midline tongue extension without atrophy or fasciculations  Motor:  Right : Upper extremity 4/5 Left: Upper extremity 5/5  Lower extremity 4/5 Lower extremity 5/5  --no drift noted  Tone and bulk:normal tone throughout;  no atrophy noted  Sensory: Pinprick and light touch intact throughout, bilaterally  Deep Tendon Reflexes:  Right: Upper Extremity Left: Upper extremity  biceps (C-5 to C-6) 2/4 biceps (C-5 to C-6) 2/4  tricep (C7) 2/4 triceps (C7) 2/4  Brachioradialis (C6) 2/4 Brachioradialis (C6) 2/4  Lower Extremity Lower Extremity  quadriceps (L-2 to L-4) 2/4 quadriceps (L-2 to L-4) 2/4  Achilles (S1) 2/4 Achilles (S1) 2/4  Plantars:  Right: downgoing Left: downgoing  Cerebellar:  Dysmetric finger-to-nose on the right likely due to decreased strangth, Ataxic heel-to-shin test on the right likely due to strength  Gait: not tested due to multiple leads.  CV: pulses palpable throughout    ASSESSMENT Mr. CLEMMIE MARXEN is a 58 y.o. male  presenting with right sided weakness.  Imaging confirms a left pontine infarct. Infarct felt to be thrombotic secondary to small vessel disease.  On no antithrombotics prior to admission. Now on aspirin 81 mg orally every day for secondary stroke prevention. Work up completed.  Hyperlipidemia, LDL 170, on no statin PTA, now on lipitor 80 mg daily, goal LDL < 100 (< 70 for diabetics) Morbid obesity, Body mass index is 40.68 kg/(m^2).  Hospital day # 1  TREATMENT/PLAN  Continue aspirin 81 mg orally every day for secondary stroke prevention.  OP PT No further stroke workup indicated. Patient has a 10-15% risk of having another stroke over the next year, the highest risk is within 2 weeks of the most recent stroke/TIA (risk of having a stroke following a stroke or TIA is the same). Ongoing risk factor control by Primary Care Physician - wt loss, exercise, lipids Stroke Service will sign off. Please call should any needs arise. Follow up with Dr. Pearlean Brownie, Stroke Clinic, in 2 months.   Annie Main, MSN, RN, ANVP-BC, ANP-BC, [facility] Pager: [phone] 10/09/2013 12:36 PM  I have personally obtained a history, examined the patient, evaluated  imaging results, and formulated the assessment and plan of care. I agree with the above.  Elspeth Cho, DO Neurology-Stroke

## 2013-10-09 NOTE — Progress Notes (Signed)
   CARE MANAGEMENT NOTE 10/09/2013  Patient:  Lee Adams,Lee Adams   Account Number:  [id number]  Date Initiated:  10/09/2013  Documentation initiated by:  Jiles CrockerHANDLER,Ziyad Dyar  Subjective/Objective Assessment:   ADMITTED WITH CVA     Action/Plan:   CM FOLLOWING FOR DCP   Anticipated DC Date:  10/21/2013   Anticipated DC Plan: POSSIBLY HOME W HOME HEALTH SERVICES, AWAITING ON PT/OT EVALS FOR DC NEEDS   DC Planning Services  CM consult         Status of service:  In process, will continue to follow Medicare Important Message given?  NA - LOS <3 / Initial given by admissions (If response is "NO", the following Medicare IM given date fields will be blank)  Per UR Regulation:  Reviewed for med. necessity/level of care/duration of stay  Comments:  1/2/2014Abelino Derrick- B Wylodean Shimmel RN,BSN,MHA [phone]

## 2013-10-09 NOTE — H&P (Signed)
Internal Medicine Attending Admission Note Date: 10/09/2013  Patient name: Lee BrockDermot L Adams Medical record number: [id number] Date of birth: 1956/07/30 Age: 58 y.o. Gender: male  I saw and evaluated the patient. I reviewed the resident's note and I agree with the resident's findings and plan as documented in the resident's note.  [name] is a 58 year old man with no previous significant past medical history who [facility] with a two-day history of right-sided weakness and clumsiness. His wife also notes some slowed speech during that time. At [facility] he was noted to have a negative CT but was transferred to [facility] ED for an MRI. The MRI showed a left pontine infarct which was acute. This is felt to be secondary to small vessel disease. The MRA was remarkable for a severe stenosis at the left MCA bifurcation putting him at higher risk for a recurrent stroke. He was admitted to the internal medicine teaching service for further evaluation.  While in the hospital he was noted to have a sugar of 409. He admits to several weeks of polyuria and polydipsia. A hemoglobin A1c was 14.8%. For this newly diagnosed diabetes he was placed on Lantus 10 units twice a day. Further titration can occur in the primary care clinic. He was also found to be hyperlipidemic with an LDL of 170. He was therefore started on Lipitor 80 mg by mouth at night. He was mildly hypertensive but the decision to treat acutely was deferred to allow for some permissive hypertension given his acute stroke. A possible pulmonary nodule was seen on chest x-ray. This was evaluated with a CT scan and demonstrated a 4 mm x 2 mm lesion. He denied any prior history of smoking, exposure to asbestos or other chemicals. Therefore he is felt to be low risk and no further evaluation of this tiny nodule is required. 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 Carotid Dopplers and an echocardiogram to complete the stroke workup were unremarkable. He is therefore stable for discharge home with the need to establish a new primary care provider.  A list of potential providers was given to him. As an outpatient his hypertension, obesity, and lipids will need to be further managed.

## 2013-10-09 NOTE — Progress Notes (Signed)
SLP Cancellation Note  Patient Details Name: Lee BrockDermot L Eliot MRN: [id number] DOB: 1956-08-14   Cancelled treatment:       Reason Eval/Treat Not Completed: Patient at procedure or test/unavailable. Pt not in room. SLP with order to evaluate speech and language.  Per chart, pt with persistent dysarthria. PT has suggested outpatient f/u, likely to best setting for f/u for SLP as well if dysarthria persists. If SLP eval is not able to be completed prior to d/c, please order outpatient SLP f/u . Also, pt has passed stroke swallow screen, but pontine CVA concerning for dysphagia. Does pt have ANY evidence of trouble swallowing that would required SLP Bedside swallow? If so, please order.    Arrick Dutton, Riley NearingBonnie Caroline 10/09/2013, 9:37 AM

## 2013-10-09 NOTE — Progress Notes (Signed)
CM CONSULT   Patient does not have a PCP but plans to visit his [facility] at discharge; Patient has [employer] private insurance and does not have any problem getting his medication; CM will continue to follow as needed for DCP; Alexis GoodellB Cadden Elizondo RN,BSN,MHA [phone]

## 2013-10-09 NOTE — Evaluation (Signed)
Physical Therapy Evaluation Patient Details Name: Lee Adams MRN: [id number] DOB: 11-04-1955 Today's Date: 10/09/2013 Time: 1971-8569 PT Time Calculation (min): 25 min  PT Assessment / Plan / Recommendation History of Present Illness  Pt is a 58 yo male s/p left pontine infarct. Patient continues to have right arm and leg weakness along with dysarthria  Clinical Impression  Patient demonstrates no acute PT needs. Pt presents with modest deficits that may be addressed at outpatient PT if symptoms do not resolve. Acute PT will sign off.     PT Assessment  All further PT needs can be met in the next venue of care    Follow Up Recommendations  Outpatient PT (if coordination deficits do not fully resolve)          Equipment Recommendations  None recommended by PT          Precautions / Restrictions Restrictions Weight Bearing Restrictions: No   Pertinent Vitals/Pain No pain      Mobility  Bed Mobility Bed Mobility: Supine to Sit;Sitting - Scoot to Edge of Bed Supine to Sit: 7: Independent Sitting - Scoot to Edge of Bed: 7: Independent Details for Bed Mobility Assistance: no physical assist needed Transfers Transfers: Sit to Stand;Stand to Sit Sit to Stand: 7: Independent Stand to Sit: 7: Independent Details for Transfer Assistance: no physical assist required Ambulation/Gait Ambulation/Gait Assistance: 7: Independent Ambulation Distance (Feet): 420 Feet Assistive device: None Ambulation/Gait Assistance Details: some instability note but no assist required, RLE coordination deficits but no major impact on general mobility Gait Pattern: Step-through pattern;Decreased stride length;Decreased weight shift to right;Antalgic;Narrow base of support Gait velocity: decreased General Gait Details: some decreased gait and coordination deficits with mobility Stairs: Yes Stairs Assistance: 5: Supervision;4: Min guard Stairs Assistance Details (indicate cue type and reason):  VCs for sequencing with RLE  deficits Stair Management Technique: One rail Right;Alternating pattern;Step to pattern;Forwards Number of Stairs: 12 Modified Rankin (Stroke Patients Only) Pre-Morbid Rankin Score: No symptoms Modified Rankin: No significant disability    Exercises     PT Diagnosis: Abnormality of gait  PT Problem List: Decreased strength;Decreased balance;Decreased coordination PT Treatment Interventions:       PT Goals(Current goals can be found in the care plan section) Acute Rehab PT Goals Patient Stated Goal: to go home PT Goal Formulation: With patient Time For Goal Achievement: 10/23/13 Potential to Achieve Goals: Good  Visit Information  Last PT Received On: 10/09/13 Assistance Needed: +1 History of Present Illness: Pt is a 58 yo male s/p left pontine infarct. Patient continues to have right arm and leg weakness along with dysarthria       Prior Functioning  Home Living Family/patient expects to be discharged to:: Private residence Living Arrangements: Spouse/significant other Available Help at Discharge: Family Type of Home: House Home Access: Stairs to enter Secretary/administrator of Steps: 12 Entrance Stairs-Rails: Can reach both Home Layout: Two level;Bed/bath upstairs Alternate Level Stairs-Number of Steps: 12 Alternate Level Stairs-Rails: Can reach both Home Equipment:  (walking sticks) Additional Comments: walkin shower standard height toilets Prior Function Level of Independence: Independent Dominant Hand: Right    Cognition  Cognition Arousal/Alertness: Awake/alert Behavior During Therapy: WFL for tasks assessed/performed Overall Cognitive Status: Within Functional Limits for tasks assessed    Extremity/Trunk Assessment Upper Extremity Assessment Upper Extremity Assessment: RUE deficits/detail RUE Sensation: decreased proprioception RUE Coordination: decreased fine motor;decreased gross motor Lower Extremity Assessment Lower  Extremity Assessment: RLE deficits/detail RLE Deficits / Details: decreased coordination and some weakness  with functional tasks RLE Sensation: decreased proprioception RLE Coordination: decreased fine motor;decreased gross motor   Balance Balance Balance Assessed: Yes Standardized Balance Assessment Standardized Balance Assessment: Dynamic Gait Index Dynamic Gait Index Level Surface: Normal Change in Gait Speed: Mild Impairment Gait with Horizontal Head Turns: Normal Gait with Vertical Head Turns: Normal Gait and Pivot Turn: Normal Step Over Obstacle: Mild Impairment Step Around Obstacles: Mild Impairment Steps: Mild Impairment Total Score: 20 High Level Balance High Level Balance Activites: Side stepping;Backward walking;Direction changes;Turns;Head turns High Level Balance Comments: some instability noted but no physical assist required  End of Session PT - End of Session Equipment Utilized During Treatment: Gait belt Activity Tolerance: Patient tolerated treatment well Patient left: in chair;with call bell/phone within reach Nurse Communication: Mobility status  GP Functional Assessment Tool Used: DGI Functional Limitation: Mobility: Walking and moving around Mobility: Walking and Moving Around Current Status ([id number]): At least 1 percent but less than 20 percent impaired, limited or restricted Mobility: Walking and Moving Around Goal Status [phone]): At least 1 percent but less than 20 percent impaired, limited or restricted Mobility: Walking and Moving Around Discharge Status [phone]): At least 1 percent but less than 20 percent impaired, limited or restricted   Duncan Dull 10/09/2013, 8:31 AM Alben Deeds, PT DPT  [phone]

## 2013-10-09 NOTE — Progress Notes (Signed)
VASCULAR LAB PRELIMINARY  PRELIMINARY  PRELIMINARY  PRELIMINARY.   Carotid duplex  completed.    Preliminary report:  Bilateral:  1-39% ICA stenosis.  Vertebral artery flow is antegrade.      Lee Adams, RVT 10/09/2013, 9:39 AM

## 2013-10-09 NOTE — Evaluation (Signed)
Occupational Therapy Evaluation Patient Details Name: Lee Adams MRN: [id number] DOB: 1956/03/02 Today's Date: 10/09/2013 Time: 7829-5621 OT Time Calculation (min): 17 min  OT Assessment / Plan / Recommendation History of present illness Pt is a 58 yo male s/p left pontine infarct. Patient continues to have right arm and leg weakness along with dysarthria   Clinical Impression   Pt admitted with above.  Pt presents with minor deficits in right UE fine/gross motor coordination but is able to perform functional tasks and will likely continue to improve.  No further acute OT services needed. Will sign off.    OT Assessment  Patient does not need any further OT services    Follow Up Recommendations  No OT follow up    Barriers to Discharge      Equipment Recommendations  None recommended by OT    Recommendations for Other Services    Frequency       Precautions / Restrictions Restrictions Weight Bearing Restrictions: No   Pertinent Vitals/Pain See vitals    ADL  Grooming: Performed;Wash/dry hands;Teeth care;Wash/dry face;Modified independent Where Assessed - Grooming: Unsupported standing Upper Body Dressing: Performed;Modified independent Where Assessed - Upper Body Dressing: Unsupported sitting Toilet Transfer: Performed;Modified independent Toilet Transfer Method: Sit to Barista: Comfort height toilet Toileting - Clothing Manipulation and Hygiene: Performed;Modified independent Where Assessed - Toileting Clothing Manipulation and Hygiene: Sit to stand from 3-in-1 or toilet Equipment Used: Gait belt Transfers/Ambulation Related to ADLs: mod I. ADL Comments: Pt using Right hand during grooming tasks.  Presents with decreased coordination and proprioception but is still able to perform functional tasks, just with increased effort.    OT Diagnosis:    OT Problem List:   OT Treatment Interventions:     OT Goals(Current goals can be found in the  care plan section) Acute Rehab OT Goals Patient Stated Goal: to go home  Visit Information  Last OT Received On: 10/09/13 Assistance Needed: +1 History of Present Illness: Pt is a 58 yo male s/p left pontine infarct. Patient continues to have right arm and leg weakness along with dysarthria       Prior Functioning     Home Living Family/patient expects to be discharged to:: Private residence Living Arrangements: Spouse/significant other Available Help at Discharge: Family Type of Home: House Home Access: Stairs to enter Secretary/administrator of Steps: 12 Entrance Stairs-Rails: Can reach both Home Layout: Two level;Bed/bath upstairs Alternate Level Stairs-Number of Steps: 12 Alternate Level Stairs-Rails: Can reach both Home Equipment:  (walking sticks) Additional Comments: walkin shower standard height toilets Prior Function Level of Independence: Independent Dominant Hand: Right         Vision/Perception Vision - History Baseline Vision: Wears glasses all the time Patient Visual Report: No change from baseline   Cognition  Cognition Arousal/Alertness: Awake/alert Behavior During Therapy: WFL for tasks assessed/performed Overall Cognitive Status: Within Functional Limits for tasks assessed    Extremity/Trunk Assessment Upper Extremity Assessment Upper Extremity Assessment: RUE deficits/detail RUE Sensation: decreased proprioception RUE Coordination: decreased fine motor;decreased gross motor Lower Extremity Assessment Lower Extremity Assessment: RLE deficits/detail RLE Deficits / Details: decreased coordination and some weakness with functional tasks RLE Sensation: decreased proprioception RLE Coordination: decreased fine motor;decreased gross motor     Mobility Bed Mobility Bed Mobility: Supine to Sit;Sitting - Scoot to Edge of Bed Supine to Sit: 7: Independent Sitting - Scoot to Edge of Bed: 7: Independent Details for Bed Mobility Assistance: no physical  assist needed Transfers Transfers: Sit  to Stand;Stand to Sit Sit to Stand: 7: Independent Stand to Sit: 7: Independent Details for Transfer Assistance: no physical assist required     Exercise     Balance    End of Session OT - End of Session Equipment Utilized During Treatment: Gait belt Activity Tolerance: Patient tolerated treatment well Patient left: in chair;with call bell/phone within reach Nurse Communication: Mobility status  GO    10/09/2013 Cipriano MileJohnson, Aldea Avis Elizabeth OTR/L Pager [phone] Office [phone]  Cipriano MileJohnson, Desere Gwin Elizabeth 10/09/2013, 8:33 AM

## 2013-10-09 NOTE — Discharge Summary (Signed)
Name: Lee Adams MRN: [id number] DOB: 02-Jul-1956 58 y.o. PCP: No Pcp Per Patient  Date of Admission: 10/08/2013 10:01 AM Date of Discharge: 10/09/2013 Attending Physician: Lee Adams  Discharge Diagnosis:  Principal Problem:   Stroke Active Problems:   Hyperglycemia   Elevated liver enzymes   CVA (cerebral infarction)   Pulmonary nodules   Diabetes type 2, uncontrolled   Diastolic dysfunction  Discharge Medications:   Medication List         aspirin 81 MG EC tablet  Take 1 tablet (81 mg total) by mouth daily.     atorvastatin 80 MG tablet  Commonly known as:  LIPITOR  Take 1 tablet (80 mg total) by mouth daily at 6 PM.     glucose blood test strip  Use as instructed     insulin glargine 100 units/mL Soln  Commonly known as:  LANTUS  Inject 15 Units into the skin 2 (two) times daily.     INSULIN SYRINGE 1CC/31GX5/16" 31G X 5/16" 1 ML Misc  - DX Code: 250.0  - Inject insulin 2 times a day.     Lancets 30G Misc  - DX Code: 250.00  - Check blood sugars before meals and at bedtime per day.     ONE TOUCH ULTRA SYSTEM KIT W/DEVICE Kit  1 kit by Does not apply route once.        Disposition and follow-up:   [name] was [facility] from [facility] in Stable condition.  At the hospital follow up visit please address:  1. Status of right arm "weakness"  2. Medication compliance  3. Diabetes control  4.  Labs / imaging needed at time of follow-up: none  5.  Pending labs/ test needing follow-up: none  Follow-up Appointments: Follow-up Information   Follow up with Lee Cellar, MD. Schedule an appointment as soon as possible for a visit in 2 months. (stroke clinic)    Specialties:  Neurology, Radiology   Contact information:   [address] [place] [address] [phone]     Dr. Adria Adams at [facility] on 1/12 at 8am  Discharge Instructions: Discharge Orders   Future Appointments Provider  Department Dept Phone   12/09/2013 2:45 PM Lee Fila, MD [facility] [phone]   Future Orders Complete By Expires   Ambulatory referral to Nutrition and Diabetic Education  As directed    Discharge instructions  As directed    Comments:     Please follow up with [facility] in 2 months (Dr. Haywood Adams and make the appointment 10/12/13 they are closed 10/09/13 [phone]   Please call and confirm your appt. with [facility] [phone].  10/19/13 at 8 am   Read all the information given   Pick up your prescriptions from the pharmacy  Take your medications as instructed   Increase activity slowly  As directed       Consultations:  neurology   Procedures Performed:  Dg Chest 2 View  10/08/2013   CLINICAL DATA:  Stroke.  EXAM: CHEST  2 VIEW  COMPARISON:  No priors.  FINDINGS: Lung volumes are normal. No consolidative airspace disease. No pleural effusions. No pneumothorax. In the right mid lung appreciated only on the frontal projection there are 2 small nodular opacities projecting over the posterior aspects of the right 7th and 8th ribs, both subcentimeter in size. Pulmonary vasculature and the cardiomediastinal silhouette are within normal limits.  IMPRESSION: 1. No radiographic  evidence of acute cardiopulmonary disease. 2. 2 small nodular opacities projecting over the right mid lung noted only on the frontal projection. Repeat standing PA and lateral chest x-ray in 2-3 months is recommended to ensure the stability or resolution of these findings.   Electronically Signed   By: Lee Adams M.D.   On: 10/08/2013 22:38   Ct Head Wo Contrast  10/08/2013   CLINICAL DATA:  Right-sided weakness.  EXAM: CT HEAD WITHOUT CONTRAST  TECHNIQUE: Contiguous axial images were obtained from the base of the skull through the vertex without intravenous contrast.  COMPARISON:  None.  FINDINGS: Ventricles, cisterns and other CSF spaces are within normal. There is  no mass, mass effect, shift of midline structures or acute hemorrhage. There is no evidence to suggest acute infarction. Bones and soft tissues are unremarkable.  IMPRESSION: No acute intracranial findings.   Electronically Signed   By: Lee Adams M.D.   On: 10/08/2013 11:02   Ct Chest Wo Contrast  10/09/2013   CLINICAL DATA:  Abnormal chest radiograph  EXAM: CT CHEST WITHOUT CONTRAST  TECHNIQUE: Multidetector CT imaging of the chest was performed following the standard protocol without IV contrast.  COMPARISON:  Chest radiographs dated 10/08/2013  FINDINGS: No CT finding to account for the possible radiographic abnormality overlying the right mid lung, which may have simply reflected prominent pulmonary markings.  4 x 2 mm subpleural nodular opacity in the medial right lower lobe (series 3/image 39), possibly reflecting a subpleural lymph node. No pleural effusion or pneumothorax.  Visualized thyroid is unremarkable.  The heart is normal size.  No pericardial effusion.  No suspicious mediastinal or axillary lymphadenopathy.  Visualized upper abdomen is notable for mild hepatic steatosis and cholecystectomy clips.  Degenerative changes of the visualized thoracolumbar spine.  IMPRESSION: No CT finding to account for the possible radiographic abnormality overlying the right mid lung.  4 x 2 mm subpleural nodular opacity in the medial right lower lobe, likely benign, possibly reflecting a subpleural lymph node. If this patient is high risk for primary bronchogenic neoplasm, a single follow-up CT chest is suggested in 12 months. If low risk, no dedicated follow-up imaging is required per Fleischner Society guidelines.  This recommendation follows the consensus statement: Guidelines for Management of Small Pulmonary Nodules Detected on CT Scans: A Statement from the [place] as published in Radiology 2005; 237:395-400.   Electronically Signed   By: Lee Adams M.D.   On: 10/09/2013 12:29   [name]  Head Wo Contrast  10/08/2013   CLINICAL DATA:  Right arm and leg numbness.  EXAM: MRI HEAD WITHOUT CONTRAST  MRA HEAD WITHOUT CONTRAST  TECHNIQUE: Multiplanar, multiecho pulse sequences of the brain and surrounding structures were obtained without intravenous contrast. Angiographic images of the head were obtained using MRA technique without contrast.  COMPARISON:  Head CT same day  FINDINGS: MRI HEAD FINDINGS  There is a 1 cm region of acute infarction affecting the left pons. No other acute infarction. No cerebellar abnormality. The cerebral hemispheres show mild chronic small-vessel changes of the deep and subcortical white matter. No cortical or large vessel territory infarction. No mass lesion, hemorrhage, hydrocephalus or extra-axial collection. No pituitary mass. No inflammatory sinus disease.  MRA HEAD FINDINGS  Both internal carotid arteries are widely patent into the brain. No siphon stenosis. The anterior and middle cerebral vessels are patent. There is atherosclerotic irregularity diffusely, most pronounced in the left MCA territory. There is stenosis at the left MCA bifurcation  that appears to be severe. This would place the patient at risk of left MCA territory infarction.  Both vertebral arteries are patent with the left being dominant. There is artifact in the region with a left vertebral artery that simulates a localized dissection. This is not the case. The basilar artery shows some atherosclerotic irregularity but no flow-limiting stenosis. Posterior circulation branch vessels appear patent. The posterior cerebral arteries show some atherosclerotic irregularity.  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  Mild chronic small-vessel changes affecting the cerebral hemispheric deep white matter.  Advanced intracranial atherosclerotic disease, most severe in the left MCA territory. Severe stenosis at the left MCA bifurcation could place the patient at risk of left MCA territory  infarction in the future.  Atherosclerotic irregularity of the posterior circulation branch vessels without dominant or correctable stenosis.   Electronically Signed   By: Nelson Chimes M.D.   On: 10/08/2013 15:52   Mr Brain Wo Contrast  10/08/2013   CLINICAL DATA:  Right arm and leg numbness.  EXAM: MRI HEAD WITHOUT CONTRAST  MRA HEAD WITHOUT CONTRAST  TECHNIQUE: Multiplanar, multiecho pulse sequences of the brain and surrounding structures were obtained without intravenous contrast. Angiographic images of the head were obtained using MRA technique without contrast.  COMPARISON:  Head CT same day  FINDINGS: MRI HEAD FINDINGS  There is a 1 cm region of acute infarction affecting the left pons. No other acute infarction. No cerebellar abnormality. The cerebral hemispheres show mild chronic small-vessel changes of the deep and subcortical white matter. No cortical or large vessel territory infarction. No mass lesion, hemorrhage, hydrocephalus or extra-axial collection. No pituitary mass. No inflammatory sinus disease.  MRA HEAD FINDINGS  Both internal carotid arteries are widely patent into the brain. No siphon stenosis. The anterior and middle cerebral vessels are patent. There is atherosclerotic irregularity diffusely, most pronounced in the left MCA territory. There is stenosis at the left MCA bifurcation that appears to be severe. This would place the patient at risk of left MCA territory infarction.  Both vertebral arteries are patent with the left being dominant. There is artifact in the region with a left vertebral artery that simulates a localized dissection. This is not the case. The basilar artery shows some atherosclerotic irregularity but no flow-limiting stenosis. Posterior circulation branch vessels appear patent. The posterior cerebral arteries show some atherosclerotic irregularity.  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  Mild chronic small-vessel changes affecting the cerebral  hemispheric deep white matter.  Advanced intracranial atherosclerotic disease, most severe in the left MCA territory. Severe stenosis at the left MCA bifurcation could place the patient at risk of left MCA territory infarction in the future.  Atherosclerotic irregularity of the posterior circulation branch vessels without dominant or correctable stenosis.   Electronically Signed   By: Nelson Chimes M.D.   On: 10/08/2013 15:52   US Abdomen Complete  10/09/2013   CLINICAL DATA:  Cholecystectomy.  Possible cirrhosis.  EXAM: ULTRASOUND ABDOMEN COMPLETE  COMPARISON:  CT 10/09/2013.  FINDINGS: Gallbladder:  Cholecystectomy. Surgical clips are noted in the gallbladder fossa on CT to confirm that a cholecystectomy has been performed. No abnormal fluid collections noted in the gallbladder fossa.  Common bile duct:  Diameter: 9.3 mm. This mild distention may be related to prior cholecystectomy. Please correlate with laboratory values.  Liver:  No focal abnormality identified. Coarse echotexture is present. This can be seen with cirrhosis and/or fatty infiltration. Portal vein is patent with normal  directional flow.  IVC:  No abnormality visualized.  Pancreas:  Visualized portion unremarkable.  Spleen:  Size and appearance within normal limits.  Right Kidney:  Length: 13.0 cm. Echogenicity within normal limits. No mass or hydronephrosis visualized.  Left Kidney:  Length: 12.8 cm. Echogenicity within normal limits. No mass or hydronephrosis visualized.  Abdominal aorta:  No aneurysm visualized.  Other findings:  None.  IMPRESSION: 1. Prior cholecystectomy. Common bile duct measures 9.3 mm. This slight dilatation is most likely from prior cholecystectomy. Correlate with liver/biliary function tests. 2. Liver demonstrates coarse echotexture. This can be seen with cirrhosis and/or fatty infiltration. Portal vein is patent with normal directional flow. 3. Coarse hepatic   Electronically Signed   By: Marcello Moores  Register   On:  10/09/2013 11:08    2D Echo: on 10/09/13 EF 50%, normal wall motion, grade I diastolic dysfunction.   Admission HPI:  Mr. Schank is a 58 year old male without significant past medical history who presented to [facility] today with 2 day complaint of right sided weakness. The sensation began yesterday evening around 4AM. He says his right side "wasn't responding." He continued with his usual evening activities, was able to eat dinner and slept without problem. When he awoke this morning the feeling was worse. He says he does not feel weak but he feels like his right side is slower to respond. He denies change in vision, gait dysfunction, lightheadedness, dizziness, headache, CP, palpitations or SOB. He lives with his wife and she did not note any facial asymmetry, dysarthria or gait abnormality. However, she did note that his speech seemed slower and that he seemed less confident while driving their new vehicle yesterday.  In the [place] ED: T 98.11F, RR 20, SpO2 100%, HR74; negative head CT; consult to neuro; BG 409.  Hospital Course by problem list: 1. Left pons stroke: Patient presented with right sided weakness, "slow" speech per wife.  MRI/MRA brain showed 1cm acute infarction affecting left pons and severe stenosis at left MCA bifurcation which could place patient at risk of future left MCA territory infarction. Patient denied history of DM, HTN, HL, or cigarette smoking (though he does smoke cigars occasionally). Troponin x 3 negative. Risk stratification: A1C 14.8%, LDL 170, TSH within normal limits. Carotid dopplers negative. Echo showed EF 50%, normal wall motion, grade I diastolic dysfunction.  Neurology was consulted and recommended ASA 81 mg daily, ongoing risk factor control by PCP, neurology follow-up in 2 months.  PT recommended outpatient PT which was arranged prior to discharge.    2. DM2, uncontrolled: A1C 14.8%. Patient reports increased thirst and urination for  the past few weeks. Total daily dose of insulin was 68 units thus basal requirement 34 units.  Lantus 15 units BID initiated while inpatient and continued at discharge.  Patient has close PCP follow-up on 1/12.   3. HL- LDL 170, initiated Lipitor 80 mg while inpatient and continued at discharge.   4. Elevated transaminases: AST 114 (120 repeat), ALT 206 (repeat 200). Hepatitis panel negative.  Abdominal ultrasound showed liver with coarse echotexture (cirrhosis vs. fatty infiltration).  Patient denies alcohol abuse history thus elevated transaminases most likely due to fatty liver.    5. Lung nodules on CXR- Admission CXR showed two small nodular opacities over right mid-lung.  Again, patient denied smoking history; worked on [employer], [employer], no known exposure to asbestos.  Non-contrast CT chest showed "no CT finding to account for possible radiographic abnormality overlying  right mid-lung;" 4 x 2 mm subpleural nodular opacity in the medial right lower lobe, likely benign, possibly reflecting a subpleural lymph node. Since this patient is  low risk, no dedicated follow-up imaging is required per Fleischner Society guidelines.   Discharge Vitals:   BP 142/64  Pulse 80  Temp(Src) 97.5 F (36.4 C) (Oral)  Resp 18  Ht 6' (1.829 m)  Wt 300 lb 0.4 oz (136.09 kg)  BMI 40.68 kg/m2  SpO2 95%  Discharge Labs:  No results found for this or any previous visit (from the past 24 hour(s)).  Signed: Ivin Poot, MD 10/12/2013, 3:25 PM   Time Spent on Discharge: 40 minutes Services Ordered on Discharge: none Equipment Ordered on Discharge: none

## 2013-12-09 ENCOUNTER — Encounter: Payer: Self-pay | Admitting: Neurology

## 2013-12-09 ENCOUNTER — Encounter (INDEPENDENT_AMBULATORY_CARE_PROVIDER_SITE_OTHER): Payer: Self-pay

## 2013-12-09 ENCOUNTER — Ambulatory Visit (INDEPENDENT_AMBULATORY_CARE_PROVIDER_SITE_OTHER): Payer: BC Managed Care – PPO | Admitting: Neurology

## 2013-12-09 VITALS — BP 133/93 | HR 76 | Ht 73.0 in | Wt 289.0 lb

## 2013-12-09 DIAGNOSIS — I639 Cerebral infarction, unspecified: Secondary | ICD-10-CM

## 2013-12-09 DIAGNOSIS — I6350 Cerebral infarction due to unspecified occlusion or stenosis of unspecified cerebral artery: Secondary | ICD-10-CM

## 2013-12-09 DIAGNOSIS — E782 Mixed hyperlipidemia: Secondary | ICD-10-CM

## 2013-12-09 DIAGNOSIS — E1110 Type 2 diabetes mellitus with ketoacidosis without coma: Secondary | ICD-10-CM

## 2013-12-09 DIAGNOSIS — E7849 Other hyperlipidemia: Secondary | ICD-10-CM

## 2013-12-09 NOTE — Progress Notes (Signed)
[facility] [address] [place]. [place] [address] [phone]       OFFICE FOLLOW-UP NOTE  [name] Date of Birth:  04/09/1956 Medical Record Number:  [id number]   HPI: 57year Caucasian male seen for first office followup visit for hospital admission on 10/07/13 for stroke. He presented with sudden onset of right arm and leg weakness and funny sensation. He also noted clumsiness in his right hand. He did not seek medical attention immediately and woke up the next day and noticed symptoms have not resolved. He presented to emergency room and blood sugar was elevated at 409 mg percent. MRI scan the brain showed left paramedian pontine infarct and there were mild changes of chronic small vessel disease. 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 Transthoracic echo showed 50% ejection fraction without cutting first one wasn't. Carotid Doppler showed no significant extra stenosis. EKG and telemetry monitoring showed sinus rhythm. Lipid profile showed elevated LDL cholesterol of 170 for which she was started on Lipitor.HbA1c was elevated at 14.8. Patient was also found to have diabetes, hypertension and obesity as risk factors. He states his general discharge his right-sided weakness and gait difficulties and numbness have improved. Is to have some weakness and heaviness of his right leg particular and is tired and diminished fine motor skills on the right. Is thinking about returning back to work but is not confident that he'll be able to handle his workload. He has finished physical occupational therapy. He states his sugars being much better and fasting sugars recently have consistently ranging in the 90-120 range. He plans to see his primary physician tomorrow and have lipid profile and improved A1c rechecked. He is tolerating aspirin well without significant bleeding or other side  effects.  ROS:   14 system review of systems is positive for  weakness, walking difficulty only and all other systems negative PMH:  Past Medical History  Diagnosis Date  . Stroke   . Diabetes mellitus without complication     Social History:  History   Social History  . Marital Status: Married    Spouse Name: natalie    Number of Children: 0  . Years of Education: B.S   Occupational History  . RECEIVING    Social History Main Topics  . Smoking status: Never Smoker   . Smokeless tobacco: Not on file  . Alcohol Use: No  . Drug Use: No  . Sexual Activity: Not on file   Other Topics Concern  . Not on file   Social History Narrative  . No narrative on file    Medications:   Current Outpatient Prescriptions on File Prior to Visit  Medication Sig Dispense Refill  . aspirin EC 81 MG EC tablet Take 1 tablet (81 mg total) by mouth daily.  30 tablet  11  . atorvastatin (LIPITOR) 80 MG tablet Take 1 tablet (80 mg total) by mouth daily at 6 PM.  30 tablet  2  . Blood Glucose Monitoring Suppl (ONE TOUCH ULTRA SYSTEM KIT) W/DEVICE KIT 1 kit by Does not apply route once.  1 each  0  . glucose blood test strip Use as instructed  100 each  12  . insulin glargine (LANTUS) 100 units/mL SOLN Inject 15 Units into the skin 2 (two) times daily.  10 mL  3  . Insulin Syringe-Needle U-100 (INSULIN SYRINGE 1CC/31GX5/16") 31G X 5/16" 1 ML MISC DX Code:   250.0 Inject insulin 2 times a day.  100 each  11  . Lancets 30G MISC DX Code: 250.00 Check blood sugars before meals and at bedtime per day.  100 each  11   No current facility-administered medications on file prior to visit.    Allergies:  No Known Allergies  Physical Exam General: well developed, well nourished, seated, in no evident distress Head: head normocephalic and atraumatic. Orohparynx benign Neck: supple with no carotid or supraclavicular bruits Cardiovascular: regular rate and rhythm, no murmurs Musculoskeletal: no  deformity Skin:  no rash/petichiae Vascular:  Normal pulses all extremities Filed Vitals:   12/09/13 1514  BP: 133/93  Pulse: 76   Neurologic Exam Mental Status: Awake and fully alert. Oriented to place and time. Recent and remote memory intact. Attention span, concentration and fund of knowledge appropriate. Mood and affect appropriate.  Cranial Nerves: Fundoscopic exam reveals sharp disc margins. Pupils equal, briskly reactive to light. Extraocular movements full without nystagmus. Saccadic dysmetria on lateral gaze left more than right Visual fields full to confrontation. Hearing intact. Facial sensation intact. Face, tongue, palate moves normally and symmetrically.  Motor: Normal bulk and tone. Normal strength in all tested extremity muscles. Diminished fine finger movements on the right. Orbits left-to-right upper extremity. Mild weakness of right hip flexors and right grip Sensory.: intact to touch and pinprick and vibratory sensation.  Coordination: Rapid alternating movements normal in all extremities. Finger-to-nose and heel-to-shin performed accurately bilaterally. Gait and Station: Arises from chair without difficulty. Stance is normal. The flanks right leg minimally while walking Gait demonstrates normal stride length and balance . Able to heel, toe and tandem walk with slightt difficulty.  Reflexes: 1+ and symmetric. Toes downgoing.   NIHSS  0 Modified Rankin  1  ASSESSMENT: 57 year patient with left paramedian pontine infarct in January 2015 secondary to small vessel disease with vascular risk factors of hypertension, diabetes , asymptomatic left MCA stenosis, Obesity and hyperlipidemia.    PLAN: I had a long discussion with the patient regarding his recent stroke, discuss results of hospital evaluation, secondary stroke prevention strategies and answered questions. Continue aspirin for stroke prevention and strict control of diabetes with hemoglobin A1c goal below 6.5%,  lipids with LDL cholesterol goal below 70 mg percent and hypertension with blood pressure goal below 130/90. Check fasting lipid profile, LFTs and hemoglobin A1c tomorrow at primary care physician's visit. Return for followup with Lin  Lamb, NP in 3 months or earlier if necessary    Note: This document was prepared with digital dictation and possible smart phrase technology. Any transcriptional errors that result from this process are unintentional  

## 2013-12-09 NOTE — Patient Instructions (Addendum)
I had a long discussion with the patient regarding his recent stroke, discuss results of hospital evaluation, secondary stroke prevention strategies and answered questions. Continue aspirin for stroke prevention and strict control of diabetes with hemoglobin A1c goal below 6.5%, lipids with LDL cholesterol goal below 70 mg percent and hypertension with blood pressure goal below 130/90. Check fasting lipid profile, LFTs and hemoglobin A1c tomorrow at primary care physician's visit. Return for followup with Vonzella Nipple, NP in 3 months or earlier if necessary  Stroke Prevention Some medical conditions and behaviors are associated with an increased chance of having a stroke. You may prevent a stroke by making healthy choices and managing medical conditions. HOW CAN I REDUCE MY RISK OF HAVING A STROKE?   Stay physically active. Get at least 30 minutes of activity on most or all days.  Do not smoke. It may also be helpful to avoid exposure to secondhand smoke.  Limit alcohol use. Moderate alcohol use is considered to be:  No more than 2 drinks per day for men.  No more than 1 drink per day for nonpregnant women.  Eat healthy foods. This involves  Eating 5 or more servings of fruits and vegetables a day.  Following a diet that addresses high blood pressure (hypertension), high cholesterol, diabetes, or obesity.  Manage your cholesterol levels.  A diet low in saturated fat, trans fat, and cholesterol and high in fiber may control cholesterol levels.  Take any prescribed medicines to control cholesterol as directed by your health care provider.  Manage your diabetes.  A controlled-carbohydrate, controlled-sugar diet is recommended to manage diabetes.  Take any prescribed medicines to control diabetes as directed by your health care provider.  Control your hypertension.  A low-salt (sodium), low-saturated fat, low-trans fat, and low-cholesterol diet is recommended to manage  hypertension.  Take any prescribed medicines to control hypertension as directed by your health care provider.  Maintain a healthy weight.  A reduced-calorie, low-sodium, low-saturated fat, low-trans fat, low-cholesterol diet is recommended to manage weight.  Stop drug abuse.  Avoid taking birth control pills.  Talk to your health care provider about the risks of taking birth control pills if you are over 35 years old, smoke, get migraines, or have ever had a blood clot.  Get evaluated for sleep disorders (sleep apnea).  Talk to your health care provider about getting a sleep evaluation if you snore a lot or have excessive sleepiness.  Take medicines as directed by your health care provider.  For some people, aspirin or blood thinners (anticoagulants) are helpful in reducing the risk of forming abnormal blood clots that can lead to stroke. If you have the irregular heart rhythm of atrial fibrillation, you should be on a blood thinner unless there is a good reason you cannot take them.  Understand all your medicine instructions.  Make sure that other other conditions (such as anemia or atherosclerosis) are addressed. SEEK IMMEDIATE MEDICAL CARE IF:   You have sudden weakness or numbness of the face, arm, or leg, especially on one side of the body.  Your face or eyelid droops to one side.  You have sudden confusion.  You have trouble speaking (aphasia) or understanding.  You have sudden trouble seeing in one or both eyes.  You have sudden trouble walking.  You have dizziness.  You have a loss of balance or coordination.  You have a sudden, severe headache with no known cause.  You have new chest pain or an irregular heartbeat.  Any of these symptoms may represent a serious problem that is an emergency. Do not wait to see if the symptoms will go away. Get medical help at once. Call your local emergency services  (911 in [place] Do not drive yourself to the hospital. Document  Released: 11/01/2004 Document Revised: 07/15/2013 Document Reviewed: 03/27/2013 [facility] Patient Information 2014 [place], [place].

## 2013-12-17 ENCOUNTER — Telehealth: Payer: Self-pay | Admitting: Nurse Practitioner

## 2013-12-17 NOTE — Telephone Encounter (Signed)
Patient needs a letter stating that he can return to work. Please call to advise.

## 2013-12-18 NOTE — Telephone Encounter (Signed)
Lee Adams. Kindly do so.

## 2013-12-18 NOTE — Telephone Encounter (Signed)
Patient states he needs a letter stating he can return to work at [employer].  Patient saw Dr. Pearlean BrownieSethi on 12/09/2013.

## 2013-12-21 ENCOUNTER — Encounter: Payer: Self-pay | Admitting: *Deleted

## 2013-12-21 NOTE — Telephone Encounter (Signed)
Wrote note and called  lvm for patient to come pick up letter.

## 2013-12-24 ENCOUNTER — Telehealth: Payer: Self-pay | Admitting: Neurology

## 2013-12-24 NOTE — Telephone Encounter (Signed)
Pt called.  He stated that the letter that was written for him to return to work was not the same thing that Dr. Pearlean BrownieSethi told him during the visit.  He states that Dr. Pearlean BrownieSethi stated he only wanted the patient to go back to work part time and the letter he received states no restrictions.  Please reference note from 12-17-13.  He would like clarification and if necessary a corrected note.  Please call to advise.  Thank you.

## 2013-12-24 NOTE — Telephone Encounter (Signed)
Pt's information was given to Dr. Marlis EdelsonSethi's assistant, Jones Broomicheda. Please advise

## 2013-12-28 ENCOUNTER — Telehealth: Payer: Self-pay | Admitting: Neurology

## 2013-12-28 NOTE — Telephone Encounter (Signed)
Talked to patient on Friday and let him know Dr. Pearlean BrownieSethi , said he can go back to work without  Restriction after review his chart... The patient states today his wife is concerned with him going back  fulltime I asked him what was his concerns and he really didn't have any besides him Saying  Dr. Paulita FujitaSethi  Said he cant go  back full time. I let him know again Dr. Pearlean BrownieSethi does not see any reason for him not to go back full time.

## 2013-12-28 NOTE — Telephone Encounter (Signed)
Needs to discuss why patient is to go back to work full time instead of still working part time--please call.

## 2013-12-29 NOTE — Telephone Encounter (Signed)
This message has been complete. Review notes in chart

## 2014-01-07 ENCOUNTER — Other Ambulatory Visit (HOSPITAL_COMMUNITY): Payer: Self-pay | Admitting: Internal Medicine

## 2014-01-13 ENCOUNTER — Other Ambulatory Visit (HOSPITAL_COMMUNITY): Payer: Self-pay | Admitting: Internal Medicine

## 2014-03-26 ENCOUNTER — Encounter (INDEPENDENT_AMBULATORY_CARE_PROVIDER_SITE_OTHER): Payer: Self-pay

## 2014-03-26 ENCOUNTER — Encounter: Payer: Self-pay | Admitting: Nurse Practitioner

## 2014-03-26 ENCOUNTER — Ambulatory Visit (INDEPENDENT_AMBULATORY_CARE_PROVIDER_SITE_OTHER): Payer: BC Managed Care – PPO | Admitting: Nurse Practitioner

## 2014-03-26 VITALS — BP 159/95 | HR 65 | Temp 97.4°F | Ht 73.0 in | Wt 298.0 lb

## 2014-03-26 DIAGNOSIS — E1110 Type 2 diabetes mellitus with ketoacidosis without coma: Secondary | ICD-10-CM

## 2014-03-26 DIAGNOSIS — E782 Mixed hyperlipidemia: Secondary | ICD-10-CM

## 2014-03-26 DIAGNOSIS — I639 Cerebral infarction, unspecified: Secondary | ICD-10-CM

## 2014-03-26 DIAGNOSIS — E7849 Other hyperlipidemia: Secondary | ICD-10-CM

## 2014-03-26 DIAGNOSIS — I6350 Cerebral infarction due to unspecified occlusion or stenosis of unspecified cerebral artery: Secondary | ICD-10-CM

## 2014-03-26 NOTE — Progress Notes (Signed)
PATIENT: Lee Adams DOB: December 01, 1955  REASON FOR VISIT: routine follow up for stroke HISTORY FROM: patient  HISTORY OF PRESENT ILLNESS: 19year Caucasian male seen for first office followup visit for hospital admission on 10/07/13 for stroke. He presented with sudden onset of right arm and leg weakness and funny sensation. He also noted clumsiness in his right hand. He did not seek medical attention immediately and woke up the next day and noticed symptoms have not resolved. He presented to emergency room and blood sugar was elevated at 409 mg percent. MRI scan the brain showed left paramedian pontine infarct and there were mild changes of chronic small vessel disease. 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 Transthoracic echo showed 50% ejection fraction. Carotid Doppler showed no significant extra stenosis. EKG and telemetry monitoring showed sinus rhythm. Lipid profile showed elevated LDL cholesterol of 170 for which she was started on Lipitor.  HbA1c was elevated at 14.8. Patient was also found to have diabetes, hypertension and obesity as risk factors. He states his right-sided weakness and gait difficulties and numbness have improved. Is to have some weakness and heaviness of his right leg particular and is tired and diminished fine motor skills on the right. Is thinking about returning back to work but is not confident that he'll be able to handle his workload. He has finished physical occupational therapy. He states his sugars being much better and fasting sugars recently have consistently ranging in the 90-120 range. He plans to see his primary physician tomorrow and have lipid profile and improved A1c rechecked. He is tolerating aspirin well without significant bleeding or other side effects.   UPDATE 03/26/14 (LL): Patient returns for 3 months stroke follow up.  He states his blood pressure is well  controlled although it is 159/95 in the office today.  He has establish chronic care management with Dr. Ellouise Newer at [facility] in [place].  He states his diabetes in under good control now, last Hgb a1c was less than 6.5.  Fasting sugars recently have consistently ranging in the 70-100 range.  He has follow up labs in 3 weeks at PCP.  He states that he has recovered most of his coordination with his right arm and hand but it still gets weak quicker.  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  He is tolerating aspirin well with no signs of significant bleeding or bruising. He has no new complaints today.  ROS:  14 system review of systems is positive for aching muscles, walking difficulty only and all other systems negative  ALLERGIES: No Known Allergies  HOME MEDICATIONS: Outpatient Prescriptions Prior to Visit  Medication Sig Dispense Refill  . aspirin EC 81 MG EC tablet Take 1 tablet (81 mg total) by mouth daily.  30 tablet  11  . atorvastatin (LIPITOR) 80 MG tablet Take 1 tablet (80 mg total) by mouth daily at 6 PM.  30 tablet  2  . Blood Glucose Monitoring Suppl (ONE TOUCH ULTRA SYSTEM KIT) W/DEVICE KIT 1 kit by Does not apply route once.  1 each  0  . glucose blood test strip Use as instructed  100 each  12  . insulin glargine (LANTUS) 100 units/mL SOLN Inject 15 Units into the skin 2 (two) times daily.  10 mL  3  . Insulin Syringe-Needle U-100 (INSULIN SYRINGE 1CC/31GX5/16") 31G X 5/16" 1 ML  MISC DX Code: 250.0 Inject insulin 2 times a day.  100 each  11  . Lancets 30G MISC DX Code: 250.00 Check blood sugars before meals and at bedtime per day.  100 each  11  . metFORMIN (GLUCOPHAGE) 500 MG tablet       . RELION SHORT PEN NEEDLES 31G X 8 MM MISC       . SitaGLIPtin Phosphate (JANUVIA PO) Take by mouth daily.       No facility-administered medications prior to visit.   PHYSICAL EXAM  Filed Vitals:   03/26/14 1404  BP: 159/95  Pulse: 65   Temp: 97.4 F (36.3 C)  TempSrc: Oral  Height: $Remove'6\' 1"'XSjqMui$  (1.854 m)  Weight: 298 lb (135.172 kg)   Body mass index is 39.32 kg/(m^2). No exam data present  Physical Exam  General: well developed, well nourished, seated, in no evident distress  Head: head normocephalic and atraumatic. Orohparynx benign  Neck: supple with no carotid or supraclavicular bruits  Cardiovascular: regular rate and rhythm, no murmurs, lower extrememity swelling, 2+ Musculoskeletal: no deformity  Skin: no rash/petichiae   Neurologic Exam  Mental Status: Awake and fully alert. Oriented to place and time. Recent and remote memory intact. Attention span, concentration and fund of knowledge appropriate. Mood and affect appropriate.  Cranial Nerves:  Pupils equal, briskly reactive to light. Extraocular movements full without nystagmus. Saccadic dysmetria on lateral gaze left more than right Visual fields full to confrontation. Hearing intact. Facial sensation intact. Face, tongue, palate moves normally and symmetrically.  Motor: Normal bulk and tone. Normal strength in all tested extremity muscles. Diminished fine finger movements on the right. Mild weakness of right hip flexors and right grip  Sensory: intact to touch and pinprick and vibratory sensation.  Coordination: Rapid alternating movements normal in all extremities. Finger-to-nose and heel-to-shin performed accurately bilaterally.  Gait and Station: Arises from chair without difficulty. Stance is normal. Drags right leg minimally while walking. Gait demonstrates normal stride length and balance.  Able to heel, toe and tandem walk with slightt difficulty.  Reflexes: 1+ and symmetric. Toes downgoing.    ASSESSMENT AND PLAN ASSESSMENT:  76 year patient with left paramedian pontine infarct in January 2015 secondary to small vessel disease with vascular risk factors of hypertension, diabetes, asymptomatic left MCA stenosis, Obesity and hyperlipidemia.   PLAN:  I  had a long discussion with the patient regarding his recent stroke, discuss results of hospital evaluation, secondary stroke prevention strategies and answered questions.  Continue aspirin for stroke prevention and strict control of diabetes with hemoglobin A1c goal below 7%, lipids with LDL cholesterol goal below 70 mg percent and hypertension with blood pressure goal below 130/90.  Check fasting lipid profile, LFTs and hemoglobin A1c at primary care physician's visit.  Return for followup in 6 months or earlier if necessary.   Philmore Pali, MSN, NP-C 03/26/2014, 2:39 PM [facility] [address] [place], [place] [address] [phone]  Note: This document was prepared with digital dictation and possible smart phrase technology. Any transcriptional errors that result from this process are unintentional.

## 2014-03-26 NOTE — Patient Instructions (Signed)
Continue aspirin for stroke prevention and strict control of diabetes with hemoglobin A1c goal below 7%, lipids with LDL cholesterol goal below 70 mg percent and hypertension with blood pressure goal below 130/90.  Return for followup in 6 months or earlier if necessary.  Stroke Prevention Some medical conditions and behaviors are associated with an increased chance of having a stroke. You may prevent a stroke by making healthy choices and managing medical conditions. HOW CAN I REDUCE MY RISK OF HAVING A STROKE?   Stay physically active. Get at least 30 minutes of activity on most or all days.  Do not smoke. It may also be helpful to avoid exposure to secondhand smoke.  Limit alcohol use. Moderate alcohol use is considered to be:  No more than 2 drinks per day for men.  No more than 1 drink per day for nonpregnant women.  Eat healthy foods. This involves  Eating 5 or more servings of fruits and vegetables a day.  Following a diet that addresses high blood pressure (hypertension), high cholesterol, diabetes, or obesity.  Manage your cholesterol levels.  A diet low in saturated fat, trans fat, and cholesterol and high in fiber may control cholesterol levels.  Take any prescribed medicines to control cholesterol as directed by your health care provider.  Manage your diabetes.  A controlled-carbohydrate, controlled-sugar diet is recommended to manage diabetes.  Take any prescribed medicines to control diabetes as directed by your health care provider.  Control your hypertension.  A low-salt (sodium), low-saturated fat, low-trans fat, and low-cholesterol diet is recommended to manage hypertension.  Take any prescribed medicines to control hypertension as directed by your health care provider.  Maintain a healthy weight.  A reduced-calorie, low-sodium, low-saturated fat, low-trans fat, low-cholesterol diet is recommended to manage weight.  Stop drug abuse.  Avoid taking birth  control pills.  Talk to your health care provider about the risks of taking birth control pills if you are over 58 years old, smoke, get migraines, or have ever had a blood clot.  Get evaluated for sleep disorders (sleep apnea).  Talk to your health care provider about getting a sleep evaluation if you snore a lot or have excessive sleepiness.  Take medicines as directed by your health care provider.  For some people, aspirin or blood thinners (anticoagulants) are helpful in reducing the risk of forming abnormal blood clots that can lead to stroke. If you have the irregular heart rhythm of atrial fibrillation, you should be on a blood thinner unless there is a good reason you cannot take them.  Understand all your medicine instructions.  Make sure that other other conditions (such as anemia or atherosclerosis) are addressed. SEEK IMMEDIATE MEDICAL CARE IF:   You have sudden weakness or numbness of the face, arm, or leg, especially on one side of the body.  Your face or eyelid droops to one side.  You have sudden confusion.  You have trouble speaking (aphasia) or understanding.  You have sudden trouble seeing in one or both eyes.  You have sudden trouble walking.  You have dizziness.  You have a loss of balance or coordination.  You have a sudden, severe headache with no known cause.  You have new chest pain or an irregular heartbeat. Any of these symptoms may represent a serious problem that is an emergency. Do not wait to see if the symptoms will go away. Get medical help at once. Call your local emergency services  (911 in [place] Do not drive yourself  to the hospital. Document Released: 11/01/2004 Document Revised: 07/15/2013 Document Reviewed: 03/27/2013 [facility] InstituteExit[facility] Patient Information 2015 [place], [place]. This information is not intended to replace advice given to you by your health care provider. Make sure you discuss any questions you have with your health care provider.

## 2014-03-27 NOTE — Progress Notes (Signed)
I agree with above 

## 2014-04-09 NOTE — Progress Notes (Signed)
I agree with above 

## 2014-05-04 ENCOUNTER — Encounter: Payer: BC Managed Care – PPO | Attending: Internal Medicine

## 2014-05-11 ENCOUNTER — Ambulatory Visit: Payer: BC Managed Care – PPO

## 2014-05-18 ENCOUNTER — Ambulatory Visit: Payer: BC Managed Care – PPO

## 2014-09-06 IMAGING — US US ABDOMEN COMPLETE
1 series · 13 of 25 positions shown · non-contrast
Comparison: CT 10/09/2013.

CLINICAL DATA: Cholecystectomy.  Possible cirrhosis.

EXAM:
ULTRASOUND ABDOMEN COMPLETE

[Series 1: us abdomen complete · 0.27mm/px · 13 of 60 slices shown]
[im 1/60]
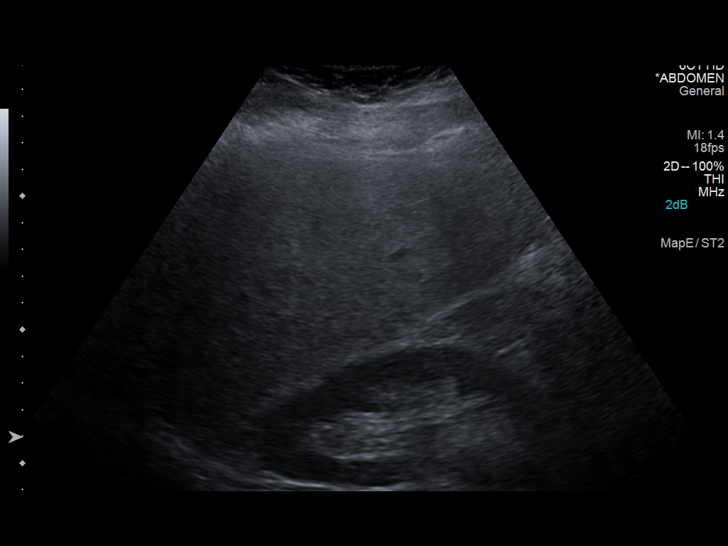
[im 5/60]
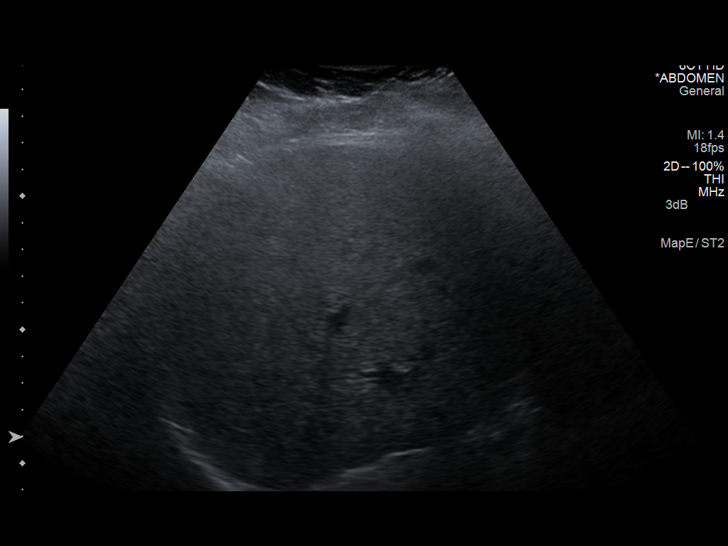
[im 10/60]
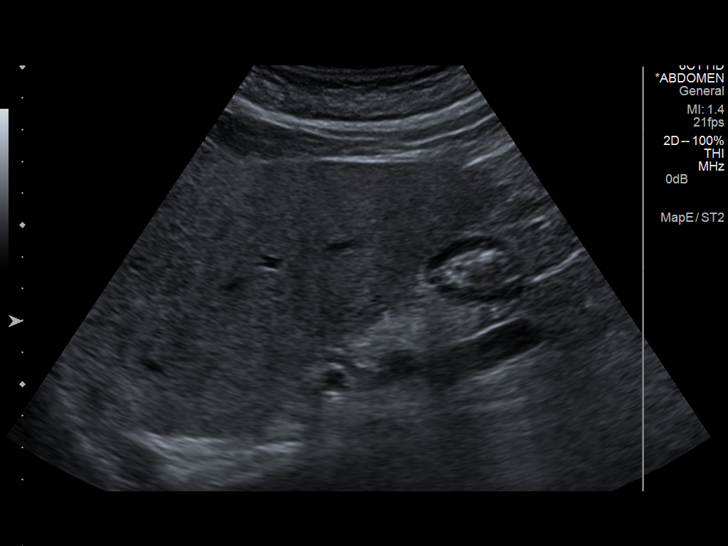
[im 15/60]
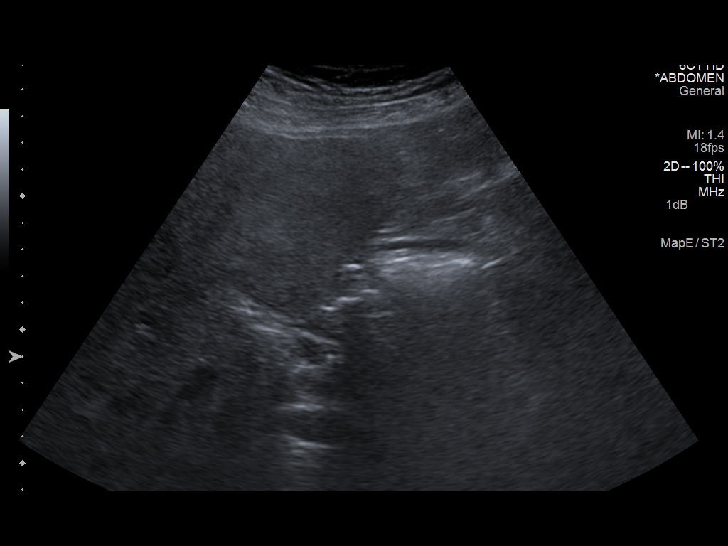
[im 20/60]
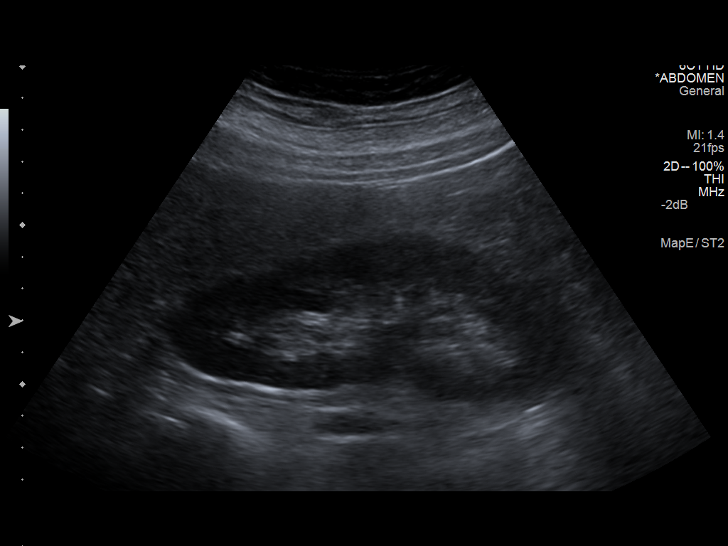
[im 25/60]
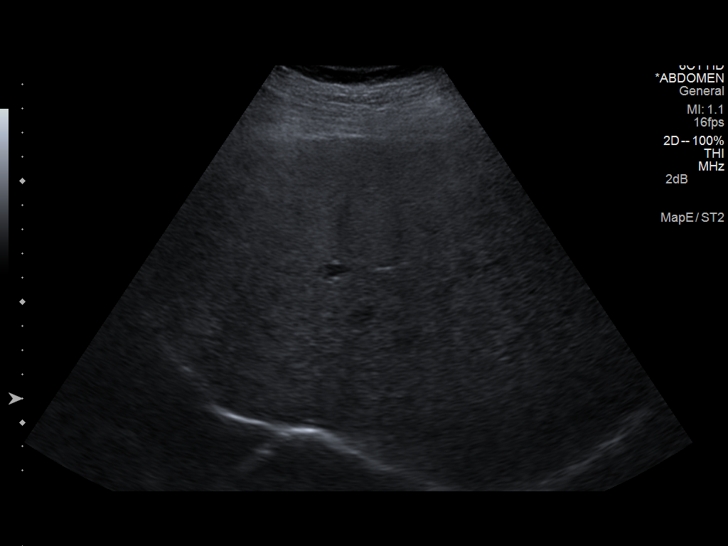
[im 30/60]
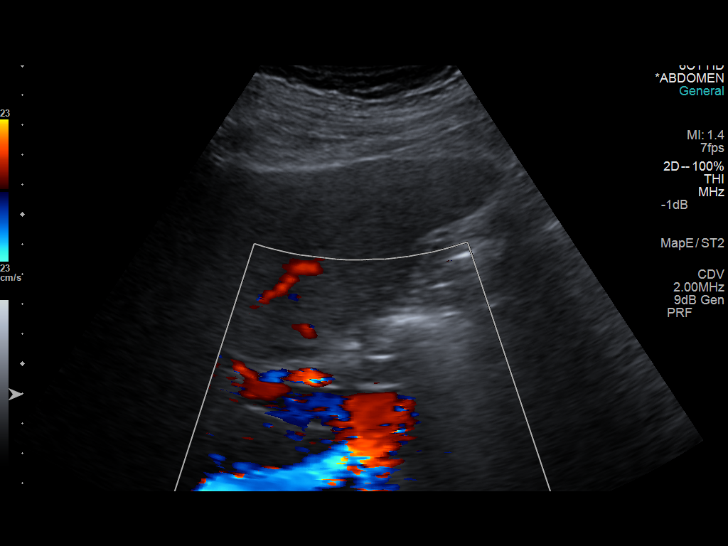
[im 35/60]
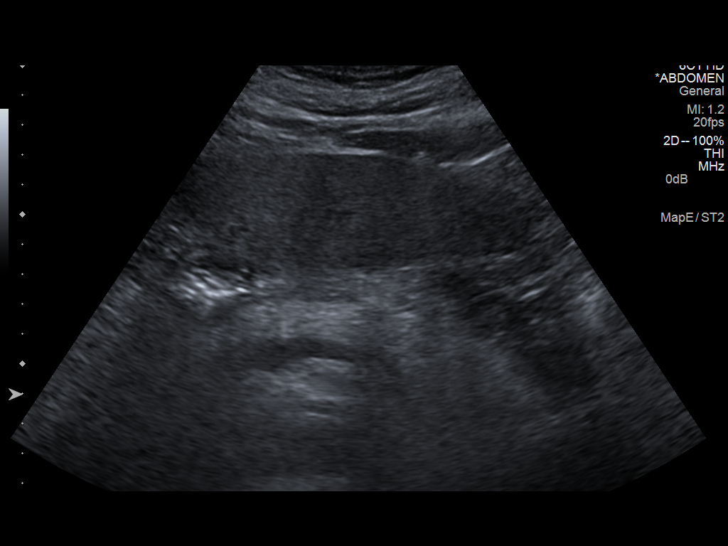
[im 40/60]
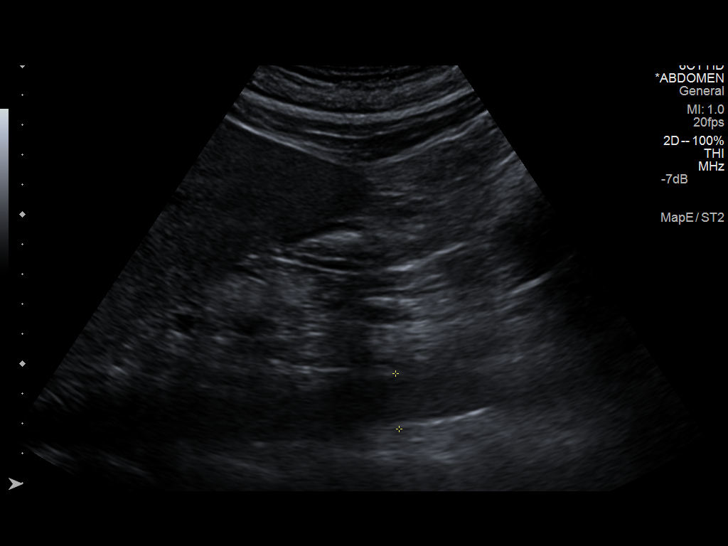
[im 45/60]
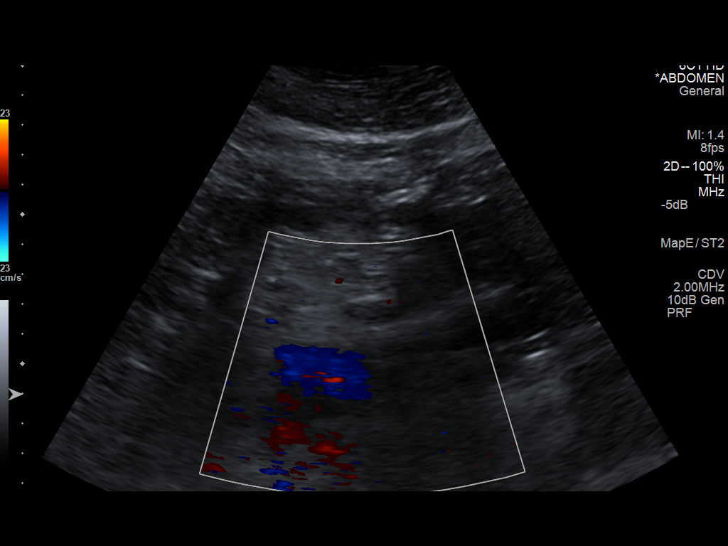
[im 50/60]
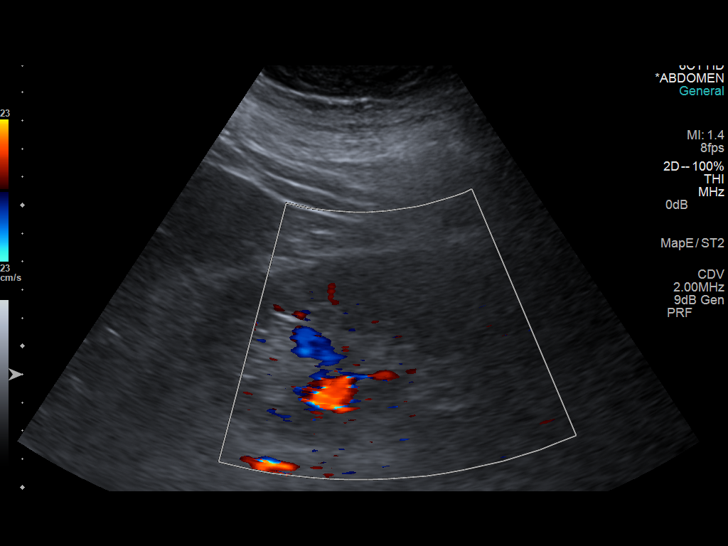
[im 55/60]
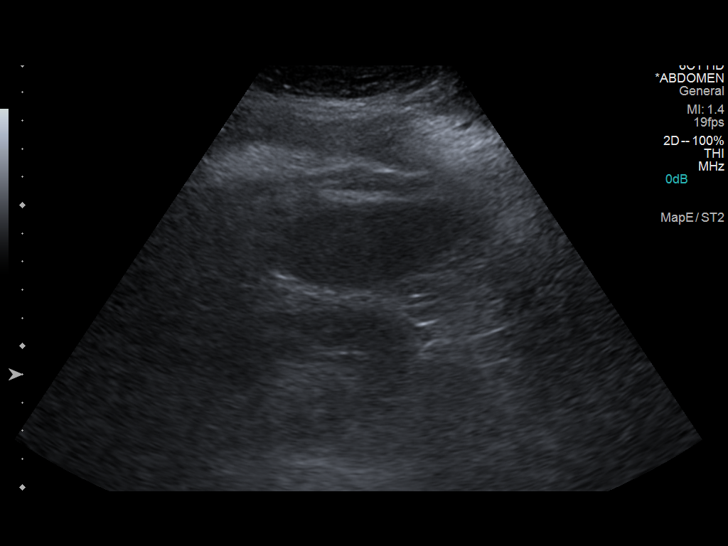
[im 60/60]
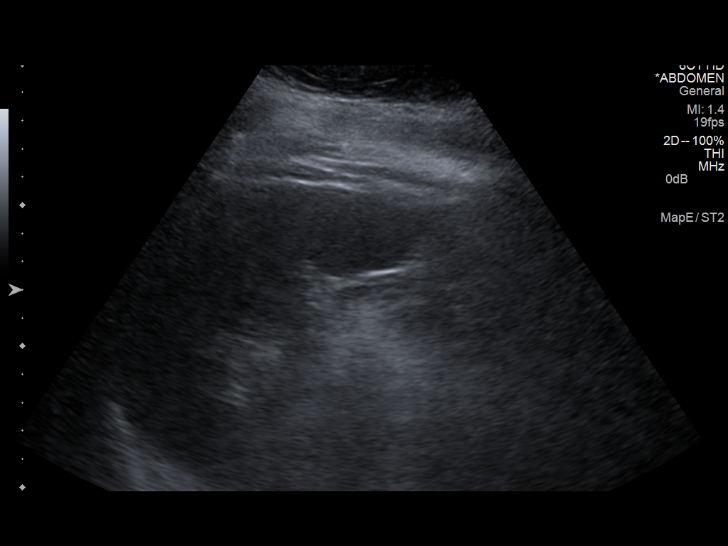

[13 of 25 positions shown; findings below may reference images not displayed]

FINDINGS: Gallbladder:

Cholecystectomy. Surgical clips are noted in the gallbladder fossa
on CT to confirm that a cholecystectomy has been performed. No
abnormal fluid collections noted in the gallbladder fossa.

Common bile duct:

Diameter: 9.3 mm. This mild distention may be related to prior
cholecystectomy. Please correlate with laboratory values.

Liver:

No focal abnormality identified. Coarse echotexture is present. This
can be seen with cirrhosis and/or fatty infiltration. Portal vein is
patent with normal directional flow.

IVC:

No abnormality visualized.

Pancreas:

Visualized portion unremarkable.

Spleen:

Size and appearance within normal limits.

Right Kidney:

Length: 13.0 cm. Echogenicity within normal limits. No mass or
hydronephrosis visualized.

Left Kidney:

Length: 12.8 cm. Echogenicity within normal limits. No mass or
hydronephrosis visualized.

Abdominal aorta:

No aneurysm visualized.

Other findings:

None.
IMPRESSION: 1. Prior cholecystectomy. Common bile duct measures 9.3 mm. This
slight dilatation is most likely from prior cholecystectomy.
Correlate with liver/biliary function tests.
2. Liver demonstrates coarse echotexture. This can be seen with
cirrhosis and/or fatty infiltration. Portal vein is patent with
normal directional flow.
3. Coarse hepatic

## 2014-09-28 ENCOUNTER — Ambulatory Visit: Payer: BC Managed Care – PPO | Admitting: Nurse Practitioner

## 2014-11-02 ENCOUNTER — Other Ambulatory Visit: Payer: Self-pay | Admitting: Internal Medicine

## 2014-11-04 ENCOUNTER — Other Ambulatory Visit: Payer: Self-pay | Admitting: Internal Medicine

## 2014-11-11 ENCOUNTER — Other Ambulatory Visit: Payer: Self-pay | Admitting: Internal Medicine

## 2014-11-12 ENCOUNTER — Telehealth: Payer: Self-pay | Admitting: *Deleted

## 2014-11-12 NOTE — Telephone Encounter (Signed)
Spoke to patient and r/s 2/18 appointment with Pearlean BrownieSethi to 10 am with NP CM

## 2014-11-16 ENCOUNTER — Other Ambulatory Visit: Payer: Self-pay | Admitting: Internal Medicine

## 2014-11-25 ENCOUNTER — Encounter: Payer: Self-pay | Admitting: Nurse Practitioner

## 2014-11-25 ENCOUNTER — Ambulatory Visit (INDEPENDENT_AMBULATORY_CARE_PROVIDER_SITE_OTHER): Payer: BLUE CROSS/BLUE SHIELD | Admitting: Nurse Practitioner

## 2014-11-25 ENCOUNTER — Ambulatory Visit: Payer: BC Managed Care – PPO | Admitting: Neurology

## 2014-11-25 VITALS — BP 137/84 | HR 68 | Temp 96.2°F | Ht 73.0 in | Wt 316.8 lb

## 2014-11-25 DIAGNOSIS — I6350 Cerebral infarction due to unspecified occlusion or stenosis of unspecified cerebral artery: Secondary | ICD-10-CM

## 2014-11-25 DIAGNOSIS — I639 Cerebral infarction, unspecified: Secondary | ICD-10-CM

## 2014-11-25 NOTE — Progress Notes (Signed)
[facility]  PATIENT: Lee Adams DOB: Jan 11, 1956   REASON FOR VISIT: Follow-up for stroke  HISTORY FROM: Patient    HISTORY OF PRESENT ILLNESS: 59year Caucasian male seen for first office followup visit for hospital admission on 10/07/13 for stroke. He presented with sudden onset of right arm and leg weakness and funny sensation. He also noted clumsiness in his right hand. He did not seek medical attention immediately and woke up the next day and noticed symptoms have not resolved. He presented to emergency room and blood sugar was elevated at 409 mg percent. MRI scan the brain showed left paramedian pontine infarct and there were mild changes of chronic small vessel disease. 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 Transthoracic echo showed 50% ejection fraction. Carotid Doppler showed no significant extra stenosis. EKG and telemetry monitoring showed sinus rhythm. Lipid profile showed elevated LDL cholesterol of 170 for which she was started on Lipitor. HbA1c was elevated at 14.8. Patient was also found to have diabetes, hypertension and obesity as risk factors. He states his right-sided weakness and gait difficulties and numbness have improved. Is to have some weakness and heaviness of his right leg particular and is tired and diminished fine motor skills on the right. Is thinking about returning back to work but is not confident that he'll be able to handle his workload. He has finished physical occupational therapy. He states his sugars being much better and fasting sugars recently have consistently ranging in the 90-120 range. He plans to see his primary physician tomorrow and have lipid profile and improved A1c rechecked. He is tolerating aspirin well without significant bleeding or other side effects.   UPDATE 11/25/14 Patient returns for  stroke follow up after last visit  59/19/2015.He has risk factors of hypertension, diabetes, asymptomatic left MCA stenosis, obesity and hyperlipidemia. He states his blood pressure is well controlled 137/84 in the office today. He has established chronic care management with Dr. Ellouise Newer at [facility] in [place]. He states his diabetes in under good control now, last Hgb a1c was less than 7 Fasting sugars recently have consistently ranging in the 70-100 range.  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 He is tolerating aspirin well with no signs of significant bleeding or bruising. He has no new complaints today. He denies difficulty sleeping/snoring   REVIEW OF SYSTEMS: Full 14 system review of systems performed and notable only for those listed, all others are neg:  Constitutional: neg  Cardiovascular: neg Ear/Nose/Throat: neg  Skin: neg Eyes: neg Respiratory: neg Gastroitestinal: neg  Hematology/Lymphatic: neg  Endocrine: neg Musculoskeletal:neg Allergy/Immunology: neg Neurological: neg Psychiatric: neg Sleep : neg   ALLERGIES: No Known Allergies  HOME MEDICATIONS: Outpatient Prescriptions Prior to Visit  Medication Sig Dispense Refill  . aspirin EC 81 MG EC tablet Take 1 tablet (81 mg total) by mouth daily. 30 tablet 11  . atorvastatin (LIPITOR) 80 MG tablet Take 1 tablet (80 mg total) by mouth daily at 6 PM. 30 tablet 2  . Blood Glucose Monitoring Suppl (ONE TOUCH ULTRA SYSTEM KIT) W/DEVICE KIT 1 kit by Does not apply route once. 1 each 0  . glucose blood test strip Use as instructed 100 each 12  . insulin glargine (LANTUS) 100 units/mL SOLN Inject 15 Units into  the skin 2 (two) times daily. (Patient taking differently: Inject 20 Units into the skin daily. ) 10 mL 3  . Insulin Syringe-Needle U-100 (INSULIN SYRINGE 1CC/31GX5/16") 31G X 5/16" 1 ML MISC DX Code: 250.0 Inject  insulin 2 times a day. 100 each 11  . Lancets 30G MISC DX Code: 250.00 Check blood sugars before meals and at bedtime per day. 100 each 11  . metFORMIN (GLUCOPHAGE) 500 MG tablet     . RELION SHORT PEN NEEDLES 31G X 8 MM MISC     . SitaGLIPtin Phosphate (JANUVIA PO) Take by mouth daily.     No facility-administered medications prior to visit.    PAST MEDICAL HISTORY: Past Medical History  Diagnosis Date  . Stroke   . Diabetes mellitus without complication     PAST SURGICAL HISTORY: Past Surgical History  Procedure Laterality Date  . Tonsillectomy    . Gallbladder surgery      FAMILY HISTORY: Family History  Problem Relation Age of Onset  . Stroke Father     SOCIAL HISTORY: History   Social History  . Marital Status: Married    Spouse Name: natalie  . Number of Children: 0  . Years of Education: B.S   Occupational History  . RECEIVING    Social History Main Topics  . Smoking status: Never Smoker   . Smokeless tobacco: Not on file  . Alcohol Use: No  . Drug Use: No  . Sexual Activity: Not on file   Other Topics Concern  . Not on file   Social History Narrative     PHYSICAL EXAM  Filed Vitals:   11/25/14 0926  BP: 137/84  Pulse: 68  Temp: 96.2 F (35.7 C)  TempSrc: Oral  Height: _0  (1.854 m)  Weight: 316 lb 12.8 oz (143.7 kg)   Body mass index is 41.81 kg/(m^2). General: well developed, morbidly obese male seated, in no evident distress  Head: head normocephalic and atraumatic. Orohparynx benign  Neck: supple with no carotid or supraclavicular bruits  Cardiovascular: regular rate and rhythm, no murmurs,  Musculoskeletal: no deformity  Skin: no rash/petichiae   Neurologic Exam  Mental Status: Awake and fully alert. Oriented to place and time. Recent and remote memory intact. Attention span, concentration and fund of knowledge appropriate. Mood and affect appropriate.  Cranial Nerves: Pupils equal, briskly reactive to light.  Extraocular movements full without nystagmus. Saccadic dysmetria on lateral gaze left more than right Visual fields full to confrontation. Hearing intact. Facial sensation intact. Face, tongue, palate moves normally and symmetrically.  Motor: Normal bulk and tone. Normal strength in all tested extremity muscles. Diminished fine finger movements on the right. Mild weakness of  right grip  Sensory: intact to touch and pinprick and vibratory sensation.  Coordination: Rapid alternating movements normal in all extremities. Finger-to-nose and heel-to-shin performed accurately bilaterally.  Gait and Station: Arises from chair without difficulty. Stance is normal.  Gait demonstrates normal stride length and balance. Able to heel, toe and tandem walk with slightt difficulty.  Reflexes: 1+ and symmetric. Toes downgoing.   DIAGNOSTIC DATA (LABS, IMAGING, TESTING) - I reviewed patient records, labs, notes, testing and imaging myself where available.  Lab Results  Component Value Date   WBC 7.8 10/08/2013   HGB 15.7 10/08/2013   HCT 43.8 10/08/2013   MCV 84.4 10/08/2013   PLT 181 10/08/2013      Component Value Date/Time   NA 137 10/09/2013 0500   K 4.4 10/09/2013 0500  CL 98 10/09/2013 0500   CO2 26 10/09/2013 0500   GLUCOSE 289* 10/09/2013 0500   BUN 20 10/09/2013 0500   CREATININE 0.73 10/09/2013 0500   CALCIUM 9.3 10/09/2013 0500   PROT 6.9 10/09/2013 0500   ALBUMIN 3.5 10/09/2013 0500   AST 120* 10/09/2013 0500   ALT 200* 10/09/2013 0500   ALKPHOS 60 10/09/2013 0500   BILITOT 0.6 10/09/2013 0500   GFRNONAA >90 10/09/2013 0500   GFRAA >90 10/09/2013 0500   Lab Results  Component Value Date   CHOL 244* 10/09/2013   HDL 33* 10/09/2013   LDLCALC 170* 10/09/2013   TRIG 206* 10/09/2013   CHOLHDL 7.4 10/09/2013   Lab Results  Component Value Date   HGBA1C 14.8* 10/09/2013    Lab Results  Component Value Date   TSH 2.903 10/08/2013      ASSESSMENT AND PLAN  59 y.o.  year old male  has a past medical history of Stroke and Diabetes mellitus without complication. here to follow-up. He has vascular risk factors of hypertension, diabetes, asymptomatic left MCA stenosis, morbid obesity and hyperlipidemia. Last carotid Doppler January 2015. The patient is a current patient of Dr. Leonie Man  who is out of the office today . This note is sent to the work in doctor.     Continue aspirin for secondary stroke prevention Strict control of diabetes with hemoglobin A1c below 7 Strict control of cholesterol with LDL below 70 Blood pressure goal below 130/90 Will check carotid Doppler and compare to January 2015 Follow-up yearly and when necessary Dennie Bible, [facility], [facility], APRN  [facility] [address] [place], [place] [address] [phone]

## 2014-11-25 NOTE — Patient Instructions (Signed)
Continue aspirin for secondary stroke prevention Strict control of diabetes with hemoglobin A1c below 7 Strict control of cholesterol with LDL below 70 Blood pressure goal below 130/90 Will check carotid Doppler and compare to January 2015 Follow-up yearly and when necessary

## 2014-11-29 NOTE — Progress Notes (Signed)
I agree with the above plan 

## 2014-12-09 ENCOUNTER — Ambulatory Visit (INDEPENDENT_AMBULATORY_CARE_PROVIDER_SITE_OTHER): Payer: BLUE CROSS/BLUE SHIELD

## 2014-12-09 DIAGNOSIS — I639 Cerebral infarction, unspecified: Secondary | ICD-10-CM | POA: Diagnosis not present

## 2014-12-09 DIAGNOSIS — I6350 Cerebral infarction due to unspecified occlusion or stenosis of unspecified cerebral artery: Secondary | ICD-10-CM

## 2014-12-23 ENCOUNTER — Telehealth: Payer: Self-pay | Admitting: Nurse Practitioner

## 2014-12-23 NOTE — Telephone Encounter (Signed)
Please let patient know his carotid doppler study was normal.

## 2014-12-23 NOTE — Telephone Encounter (Signed)
Spoke to patient. Gave carotid doppler results. Patient verbalized understanding.   

## 2015-11-24 ENCOUNTER — Encounter: Payer: Self-pay | Admitting: Nurse Practitioner

## 2015-11-24 ENCOUNTER — Ambulatory Visit (INDEPENDENT_AMBULATORY_CARE_PROVIDER_SITE_OTHER): Payer: BLUE CROSS/BLUE SHIELD | Admitting: Nurse Practitioner

## 2015-11-24 VITALS — BP 124/87 | HR 97 | Ht 73.0 in | Wt 331.4 lb

## 2015-11-24 DIAGNOSIS — E785 Hyperlipidemia, unspecified: Secondary | ICD-10-CM | POA: Insufficient documentation

## 2015-11-24 DIAGNOSIS — E119 Type 2 diabetes mellitus without complications: Secondary | ICD-10-CM | POA: Insufficient documentation

## 2015-11-24 DIAGNOSIS — I639 Cerebral infarction, unspecified: Secondary | ICD-10-CM

## 2015-11-24 DIAGNOSIS — I6320 Cerebral infarction due to unspecified occlusion or stenosis of unspecified precerebral arteries: Secondary | ICD-10-CM | POA: Diagnosis not present

## 2015-11-24 DIAGNOSIS — R739 Hyperglycemia, unspecified: Secondary | ICD-10-CM | POA: Diagnosis not present

## 2015-11-24 DIAGNOSIS — I6350 Cerebral infarction due to unspecified occlusion or stenosis of unspecified cerebral artery: Secondary | ICD-10-CM | POA: Diagnosis not present

## 2015-11-24 NOTE — Patient Instructions (Signed)
Keep systolic blood pressure less than 130, today's reading 124/87 Lipids are followed by PCP, stay on Lipitor Last Carotid Doppler 12/2014 was negative for significant stenosis.  Continue Aspirin for  secondary stroke prevention Strict control of diabetes with hemoglobin A1c below 7 No further stroke or TIA symptoms since December 2014.  If recurrent stroke symptoms occur, call 911 and proceed to the hospital Discharge from neurologic services at this time

## 2015-11-24 NOTE — Progress Notes (Signed)
[facility]  PATIENT: Lee Adams DOB: 1956-06-24   REASON FOR VISIT: History of left pontine stroke December 2014 HISTORY FROM: Patient    HISTORY OF PRESENT ILLNESS: HISTORY PS57year Caucasian male seen for first office followup visit for hospital admission on 10/07/13 for stroke. He presented with sudden onset of right arm and leg weakness and funny sensation. He also noted clumsiness in his right hand. He did not seek medical attention immediately and woke up the next day and noticed symptoms have not resolved. He presented to emergency room and blood sugar was elevated at 409 mg percent. MRI scan the brain showed left paramedian pontine infarct and there were mild changes of chronic small vessel disease. 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 Transthoracic echo showed 50% ejection fraction. Carotid Doppler showed no significant extra stenosis. EKG and telemetry monitoring showed sinus rhythm. Lipid profile showed elevated LDL cholesterol of 170 for which she was started on Lipitor. HbA1c was elevated at 14.8. Patient was also found to have diabetes, hypertension and obesity as risk factors. He states his right-sided weakness and gait difficulties and numbness have improved. Is to have some weakness and heaviness of his right leg particular and is tired and diminished fine motor skills on the right. Is thinking about returning back to work but is not confident that he'll be able to handle his workload. He has finished physical occupational therapy. He states his sugars being much better and fasting sugars recently have consistently ranging in the 90-120 range. He plans to see his primary physician tomorrow and have lipid profile and improved A1c rechecked. He is tolerating aspirin well without significant bleeding or other side effects.   UPDATE 11/25/14 CM Patient returns for stroke  follow up after last visit 03/26/2014.He has risk factors of hypertension, diabetes, asymptomatic left MCA stenosis, obesity and hyperlipidemia. He states his blood pressure is well controlled 137/84 in the office today. He has established chronic care management with Dr. Ellouise Newer at [facility] in [place]. He states his diabetes in under good control now, last Hgb a1c was less than 7 Fasting sugars recently have consistently ranging in the 70-100 range. 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 He is tolerating aspirin well with no signs of significant bleeding or bruising. He has no new complaints today. He denies difficulty sleeping/snoring UPDATE the 16th 2017 Mr. Roesler, 60 year old male returns for follow-up. He has a history of stroke which occurred in December 2014. He has not had further stroke or TIA symptoms since that time. He is currently on aspirin for secondary stroke prevention. There are no signs of significant bleeding or bruising. He has risk factors of hypertension, diabetes and obesity and hyperlipidemia and asymptomatic left MCA stenosis. His blood pressure is well controlled at 124/87 in the office today. He just had labs done for his lipids and hemoglobin A1c by primary care Dr. Ellouise Newer at [facility] care in [place], [place].  He does not know results. He gets no regular exercise. 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 He has not worked since his stroke . He denies any difficulty with sleeping or snoring. He returns for reevaluation   REVIEW OF SYSTEMS: Full 14 system review of systems performed and notable only for those listed,  all others are neg:  Constitutional: neg  Cardiovascular: neg Ear/Nose/Throat: neg  Skin: neg Eyes: neg Respiratory: neg Gastroitestinal: neg  Hematology/Lymphatic: neg  Endocrine:  neg Musculoskeletal:neg Allergy/Immunology: neg Neurological: neg Psychiatric: neg Sleep : neg   ALLERGIES: No Known Allergies  HOME MEDICATIONS: Outpatient Prescriptions Prior to Visit  Medication Sig Dispense Refill  . aspirin EC 81 MG EC tablet Take 1 tablet (81 mg total) by mouth daily. 30 tablet 11  . atorvastatin (LIPITOR) 80 MG tablet Take 1 tablet (80 mg total) by mouth daily at 6 PM. 30 tablet 2  . Blood Glucose Monitoring Suppl (ONE TOUCH ULTRA SYSTEM KIT) W/DEVICE KIT 1 kit by Does not apply route once. 1 each 0  . bumetanide (BUMEX) 0.5 MG tablet Take 0.5 mg by mouth daily.    Marland Kitchen glucose blood test strip Use as instructed 100 each 12  . insulin glargine (LANTUS) 100 units/mL SOLN Inject 15 Units into the skin 2 (two) times daily. (Patient taking differently: Inject 20 Units into the skin daily. ) 10 mL 3  . Insulin Syringe-Needle U-100 (INSULIN SYRINGE 1CC/31GX5/16") 31G X 5/16" 1 ML MISC DX Code: 250.0 Inject insulin 2 times a day. 100 each 11  . JANUVIA 100 MG tablet Take 1 tablet by mouth daily.    . Lancets 30G MISC DX Code: 250.00 Check blood sugars before meals and at bedtime per day. 100 each 11  . lisinopril (PRINIVIL,ZESTRIL) 10 MG tablet Take 1 tablet by mouth daily.    . metFORMIN (GLUCOPHAGE) 500 MG tablet     . RELION SHORT PEN NEEDLES 31G X 8 MM MISC     . ZETIA 10 MG tablet Take 1 tablet by mouth daily.     No facility-administered medications prior to visit.    PAST MEDICAL HISTORY: Past Medical History  Diagnosis Date  . Stroke ([place])   . Diabetes mellitus without complication ([place])     PAST SURGICAL HISTORY: Past Surgical History  Procedure Laterality Date  . Tonsillectomy    . Gallbladder surgery      FAMILY HISTORY: Family History  Problem Relation Age of Onset  . Stroke Father     SOCIAL HISTORY: Social History   Social History  . Marital Status: Married    Spouse Name: natalie  . Number of Children: 0  . Years of Education:  B.S   Occupational History  . RECEIVING    Social History Main Topics  . Smoking status: Never Smoker   . Smokeless tobacco: Not on file  . Alcohol Use: No  . Drug Use: No  . Sexual Activity: Not on file   Other Topics Concern  . Not on file   Social History Narrative     PHYSICAL EXAM  Filed Vitals:   11/24/15 0814  BP: 124/87  Pulse: 97  Height: '6\' 1"'$  (1.854 m)  Weight: 331 lb 6.4 oz (150.322 kg)   Body mass index is 43.73 kg/(m^2). General: well developed, morbidly obese male seated, in no evident distress  Head: head normocephalic and atraumatic. Orohparynx benign  Neck: supple with no carotid or supraclavicular bruits  Cardiovascular: regular rate and rhythm, no murmurs,  Musculoskeletal: no deformity  Skin: no rash/petichiae   Neurologic Exam  Mental Status: Awake and fully alert. Oriented to place and time. Recent and remote memory intact. Attention span, concentration and fund of knowledge appropriate. Mood and affect appropriate.  Cranial Nerves: Pupils equal, briskly reactive to light. Extraocular movements full without nystagmus. Visual fields  full to confrontation. Hearing intact. Facial sensation intact. Face, tongue, palate moves normally and symmetrically.  Motor: Normal bulk and tone. Normal strength in all tested extremity muscles.   Sensory: intact to touch and pinprick and vibratory sensation.  Coordination: Rapid alternating movements normal in all extremities. Finger-to-nose and heel-to-shin performed accurately bilaterally.  Gait and Station: Arises from chair without difficulty. Stance is normal. Gait demonstrates normal stride length and balance. Able to heel, toe and tandem walk without difficulty.  Reflexes: 1+ and symmetric. Toes downgoing.    DIAGNOSTIC DATA (LABS, IMAGING, TESTING) -  ASSESSMENT AND PLAN 60 y.o. year old male has a past medical history of Stroke 09/2013 and Diabetes mellitus without complication here to  follow-up. He has vascular risk factors of hypertension, diabetes, asymptomatic left MCA stenosis, morbid obesity and hyperlipidemia. Last carotid Doppler March  2016 was negative for stenosis.   PLAN:  Keep systolic blood pressure less than 130, today's reading 124/87 Lipids are followed by PCP, stay on Lipitor Last Carotid Doppler 12/2014 was negative for significant stenosis.  Continue Aspirin for  secondary stroke prevention Strict control of diabetes with hemoglobin A1c below 7 followed by PCP No further stroke or TIA symptoms since December 2014.  If recurrent stroke symptoms occur, call 911 and proceed to the hospital Discharge from neurologic services at this timeVst time 25 min Dennie Bible, [facility], [facility], [facility] [address], [place], [place] [address] [phone]

## 2015-11-25 ENCOUNTER — Ambulatory Visit: Payer: BLUE CROSS/BLUE SHIELD | Admitting: Nurse Practitioner

## 2017-10-16 ENCOUNTER — Encounter (INDEPENDENT_AMBULATORY_CARE_PROVIDER_SITE_OTHER): Payer: BLUE CROSS/BLUE SHIELD | Admitting: Ophthalmology

## 2017-10-16 DIAGNOSIS — H43813 Vitreous degeneration, bilateral: Secondary | ICD-10-CM | POA: Diagnosis not present

## 2017-10-16 DIAGNOSIS — H2513 Age-related nuclear cataract, bilateral: Secondary | ICD-10-CM

## 2017-10-16 DIAGNOSIS — H35033 Hypertensive retinopathy, bilateral: Secondary | ICD-10-CM

## 2017-10-16 DIAGNOSIS — I10 Essential (primary) hypertension: Secondary | ICD-10-CM | POA: Diagnosis not present

## 2017-10-16 DIAGNOSIS — H353132 Nonexudative age-related macular degeneration, bilateral, intermediate dry stage: Secondary | ICD-10-CM | POA: Diagnosis not present

## 2018-10-17 ENCOUNTER — Encounter (INDEPENDENT_AMBULATORY_CARE_PROVIDER_SITE_OTHER): Payer: BLUE CROSS/BLUE SHIELD | Admitting: Ophthalmology

## 2018-10-17 DIAGNOSIS — H353132 Nonexudative age-related macular degeneration, bilateral, intermediate dry stage: Secondary | ICD-10-CM | POA: Diagnosis not present

## 2018-10-17 DIAGNOSIS — I10 Essential (primary) hypertension: Secondary | ICD-10-CM

## 2018-10-17 DIAGNOSIS — H43813 Vitreous degeneration, bilateral: Secondary | ICD-10-CM | POA: Diagnosis not present

## 2018-10-17 DIAGNOSIS — H2513 Age-related nuclear cataract, bilateral: Secondary | ICD-10-CM

## 2018-10-17 DIAGNOSIS — H35033 Hypertensive retinopathy, bilateral: Secondary | ICD-10-CM

## 2019-10-19 ENCOUNTER — Encounter (INDEPENDENT_AMBULATORY_CARE_PROVIDER_SITE_OTHER): Payer: BLUE CROSS/BLUE SHIELD | Admitting: Ophthalmology

## 2019-11-04 ENCOUNTER — Encounter (INDEPENDENT_AMBULATORY_CARE_PROVIDER_SITE_OTHER): Payer: BC Managed Care – PPO | Admitting: Ophthalmology

## 2019-11-04 DIAGNOSIS — H35033 Hypertensive retinopathy, bilateral: Secondary | ICD-10-CM | POA: Diagnosis not present

## 2019-11-04 DIAGNOSIS — H353132 Nonexudative age-related macular degeneration, bilateral, intermediate dry stage: Secondary | ICD-10-CM | POA: Diagnosis not present

## 2019-11-04 DIAGNOSIS — I10 Essential (primary) hypertension: Secondary | ICD-10-CM | POA: Diagnosis not present

## 2019-11-04 DIAGNOSIS — H43813 Vitreous degeneration, bilateral: Secondary | ICD-10-CM | POA: Diagnosis not present

## 2019-11-04 DIAGNOSIS — H2513 Age-related nuclear cataract, bilateral: Secondary | ICD-10-CM

## 2020-11-03 ENCOUNTER — Encounter (INDEPENDENT_AMBULATORY_CARE_PROVIDER_SITE_OTHER): Payer: BC Managed Care – PPO | Admitting: Ophthalmology

## 2020-11-10 ENCOUNTER — Encounter (INDEPENDENT_AMBULATORY_CARE_PROVIDER_SITE_OTHER): Payer: BC Managed Care – PPO | Admitting: Ophthalmology

## 2020-11-10 ENCOUNTER — Other Ambulatory Visit: Payer: Self-pay

## 2020-11-10 DIAGNOSIS — H43813 Vitreous degeneration, bilateral: Secondary | ICD-10-CM | POA: Diagnosis not present

## 2020-11-10 DIAGNOSIS — H35033 Hypertensive retinopathy, bilateral: Secondary | ICD-10-CM | POA: Diagnosis not present

## 2020-11-10 DIAGNOSIS — H353132 Nonexudative age-related macular degeneration, bilateral, intermediate dry stage: Secondary | ICD-10-CM | POA: Diagnosis not present

## 2020-11-10 DIAGNOSIS — I10 Essential (primary) hypertension: Secondary | ICD-10-CM | POA: Diagnosis not present

## 2021-10-13 ENCOUNTER — Other Ambulatory Visit: Payer: Self-pay

## 2021-10-13 ENCOUNTER — Encounter (INDEPENDENT_AMBULATORY_CARE_PROVIDER_SITE_OTHER): Payer: Medicare Other | Admitting: Ophthalmology

## 2021-10-13 DIAGNOSIS — H43813 Vitreous degeneration, bilateral: Secondary | ICD-10-CM

## 2021-10-13 DIAGNOSIS — H353132 Nonexudative age-related macular degeneration, bilateral, intermediate dry stage: Secondary | ICD-10-CM | POA: Diagnosis not present

## 2021-10-13 DIAGNOSIS — H2513 Age-related nuclear cataract, bilateral: Secondary | ICD-10-CM

## 2021-10-13 DIAGNOSIS — I10 Essential (primary) hypertension: Secondary | ICD-10-CM

## 2021-10-13 DIAGNOSIS — H35033 Hypertensive retinopathy, bilateral: Secondary | ICD-10-CM | POA: Diagnosis not present

## 2021-11-10 ENCOUNTER — Encounter (INDEPENDENT_AMBULATORY_CARE_PROVIDER_SITE_OTHER): Payer: BC Managed Care – PPO | Admitting: Ophthalmology

## 2021-11-17 ENCOUNTER — Encounter (INDEPENDENT_AMBULATORY_CARE_PROVIDER_SITE_OTHER): Payer: BC Managed Care – PPO | Admitting: Ophthalmology

## 2022-10-12 ENCOUNTER — Encounter (INDEPENDENT_AMBULATORY_CARE_PROVIDER_SITE_OTHER): Payer: Medicare Other | Admitting: Ophthalmology

## 2022-10-12 DIAGNOSIS — H43813 Vitreous degeneration, bilateral: Secondary | ICD-10-CM | POA: Diagnosis not present

## 2022-10-12 DIAGNOSIS — I10 Essential (primary) hypertension: Secondary | ICD-10-CM

## 2022-10-12 DIAGNOSIS — H35033 Hypertensive retinopathy, bilateral: Secondary | ICD-10-CM | POA: Diagnosis not present

## 2022-10-12 DIAGNOSIS — H353132 Nonexudative age-related macular degeneration, bilateral, intermediate dry stage: Secondary | ICD-10-CM

## 2023-10-17 ENCOUNTER — Encounter (INDEPENDENT_AMBULATORY_CARE_PROVIDER_SITE_OTHER): Payer: Medicare Other | Admitting: Ophthalmology
